# Patient Record
Sex: Female | Born: 1975 | Race: Black or African American | Hispanic: No | Marital: Single | Smoking: Current every day smoker
Health system: Southern US, Community
[De-identification: ages and names within clinical notes are randomized; demographics above are authoritative.]

## PROBLEM LIST (undated history)

## (undated) DIAGNOSIS — L0291 Cutaneous abscess, unspecified: Secondary | ICD-10-CM

## (undated) DIAGNOSIS — Z720 Tobacco use: Secondary | ICD-10-CM

## (undated) DIAGNOSIS — G932 Benign intracranial hypertension: Secondary | ICD-10-CM

## (undated) DIAGNOSIS — J45909 Unspecified asthma, uncomplicated: Secondary | ICD-10-CM

---

## 2008-09-28 ENCOUNTER — Encounter: Admission: RE | Admit: 2008-09-28 | Discharge: 2008-09-28 | Payer: Self-pay | Admitting: Specialist

## 2012-04-14 ENCOUNTER — Encounter (HOSPITAL_COMMUNITY): Payer: Self-pay | Admitting: Emergency Medicine

## 2012-04-14 ENCOUNTER — Emergency Department (HOSPITAL_COMMUNITY): Payer: Medicaid Other

## 2012-04-14 ENCOUNTER — Emergency Department (HOSPITAL_COMMUNITY)
Admission: EM | Admit: 2012-04-14 | Discharge: 2012-04-14 | Disposition: A | Discharge status: Home / Self Care | Payer: Medicaid Other | Attending: Emergency Medicine | Admitting: Emergency Medicine

## 2012-04-14 DIAGNOSIS — E876 Hypokalemia: Secondary | ICD-10-CM | POA: Insufficient documentation

## 2012-04-14 DIAGNOSIS — Z79899 Other long term (current) drug therapy: Secondary | ICD-10-CM | POA: Insufficient documentation

## 2012-04-14 DIAGNOSIS — R079 Chest pain, unspecified: Secondary | ICD-10-CM | POA: Insufficient documentation

## 2012-04-14 DIAGNOSIS — I10 Essential (primary) hypertension: Secondary | ICD-10-CM | POA: Insufficient documentation

## 2012-04-14 DIAGNOSIS — M79603 Pain in arm, unspecified: Secondary | ICD-10-CM

## 2012-04-14 DIAGNOSIS — IMO0002 Reserved for concepts with insufficient information to code with codable children: Secondary | ICD-10-CM | POA: Insufficient documentation

## 2012-04-14 DIAGNOSIS — R55 Syncope and collapse: Secondary | ICD-10-CM | POA: Insufficient documentation

## 2012-04-14 DIAGNOSIS — M542 Cervicalgia: Secondary | ICD-10-CM | POA: Insufficient documentation

## 2012-04-14 DIAGNOSIS — M79609 Pain in unspecified limb: Secondary | ICD-10-CM | POA: Insufficient documentation

## 2012-04-14 DIAGNOSIS — R0602 Shortness of breath: Secondary | ICD-10-CM | POA: Insufficient documentation

## 2012-04-14 HISTORY — DX: Unspecified asthma, uncomplicated: J45.909

## 2012-04-14 LAB — CBC WITH DIFFERENTIAL/PLATELET
Basophils Absolute: 0 10*3/uL (ref 0.0–0.1)
Basophils Relative: 0 % (ref 0–1)
HCT: 43.9 % (ref 36.0–46.0)
Lymphocytes Relative: 21 % (ref 12–46)
Neutro Abs: 7.4 10*3/uL (ref 1.7–7.7)
Neutrophils Relative %: 70 % (ref 43–77)
Platelets: 276 10*3/uL (ref 150–400)
RDW: 16.2 % — ABNORMAL HIGH (ref 11.5–15.5)
WBC: 10.5 10*3/uL (ref 4.0–10.5)

## 2012-04-14 LAB — BASIC METABOLIC PANEL
CO2: 27 mEq/L (ref 19–32)
Chloride: 92 mEq/L — ABNORMAL LOW (ref 96–112)
GFR calc Af Amer: 65 mL/min — ABNORMAL LOW (ref >90)
Potassium: 3.1 mEq/L — ABNORMAL LOW (ref 3.5–5.1)
Sodium: 135 mEq/L (ref 135–145)

## 2012-04-14 LAB — POCT I-STAT TROPONIN I

## 2012-04-14 LAB — D-DIMER, QUANTITATIVE: D-Dimer, Quant: 0.97 ug/mL-FEU — ABNORMAL HIGH (ref 0.00–0.48)

## 2012-04-14 LAB — LACTIC ACID, PLASMA: Lactic Acid, Venous: 1.6 mmol/L (ref 0.5–2.2)

## 2012-04-14 MED ORDER — HYDROCODONE-ACETAMINOPHEN 5-325 MG PO TABS: 1.0000 | ORAL_TABLET | ORAL | Status: DC | PRN

## 2012-04-14 MED ORDER — POTASSIUM CHLORIDE CRYS ER 20 MEQ PO TBCR
40.0000 meq | EXTENDED_RELEASE_TABLET | Freq: Once | ORAL | Status: AC
  Administered 2012-04-14: 40 meq via ORAL
  Filled 2012-04-14: qty 2

## 2012-04-14 MED ORDER — POTASSIUM CHLORIDE CRYS ER 20 MEQ PO TBCR: 20.0000 meq | EXTENDED_RELEASE_TABLET | Freq: Every day | ORAL | Status: DC

## 2012-04-14 MED ORDER — IOHEXOL 350 MG/ML SOLN
100.0000 mL | Freq: Once | INTRAVENOUS | Status: AC | PRN
  Administered 2012-04-14: 100 mL via INTRAVENOUS

## 2012-04-14 MED ORDER — FENTANYL CITRATE 0.05 MG/ML IJ SOLN
50.0000 ug | Freq: Once | INTRAMUSCULAR | Status: AC
  Administered 2012-04-14: 50 ug via INTRAVENOUS
  Filled 2012-04-14: qty 2

## 2012-04-14 MED ORDER — SODIUM CHLORIDE 0.9 % IV BOLUS (SEPSIS)
1000.0000 mL | Freq: Once | INTRAVENOUS | Status: AC
  Administered 2012-04-14: 1000 mL via INTRAVENOUS

## 2012-04-14 NOTE — ED Provider Notes (Signed)
History     CSN: [id number]  Arrival date & time 04/14/12  0128   First MD Initiated Contact with Patient 04/14/12 [phone]      Chief Complaint  Patient presents with  . Shoulder Pain    (Consider location/radiation/quality/duration/timing/severity/associated sxs/prior treatment) HPI 36 year old female presents to emergency room via EMS from home with complaint of left shoulder and arm pain. She reports onset of pain 3 weeks to month ago. It is worsened over the last 24 hours. Patient denies any trauma to the area. Patient was seen at a local general medical clinic on Friday, given a pain shot and Phenergan for her nerves and pain. She is to have an x-ray on Monday for further workup of her pain. Patient has history of asthma, bipolar disorder. Patient presents with extreme pain to her left arm. She denies any chest pain, no shortness of breath, no leg swelling. She denies being on any estrogen, denies smoking, no prolonged immobilization or prolonged car rides. No prior history of DVT or PE. Patient denies any cardiac risk factors aside from hypertension. Patient is a poor historian, appears to be in significant pain, writhing on the bed  Past Medical History  Diagnosis Date  . Asthma     History reviewed. No pertinent past surgical history.  No family history on file.  History  Substance Use Topics  . Smoking status: Current Every Day Smoker    Types: Cigarettes  . Smokeless tobacco: Not on file  . Alcohol Use: No    OB History    Grav Para Term Preterm Abortions TAB SAB Ect Mult Living                  Review of Systems  Unable to perform ROS: Other   patient will not answer all questions asked  Allergies  Aspirin; Shellfish allergy; Latex; and Kiwi extract  Home Medications   Current Outpatient Rx  Name Route Sig Dispense Refill  . DULOXETINE HCL 30 MG PO CPEP Oral Take 30 mg by mouth daily.    . IPRATROPIUM-ALBUTEROL 20-100 MCG/ACT IN AERS Inhalation Inhale 1  puff into the lungs every 6 (six) hours. For shortness breath    . LISINOPRIL-HYDROCHLOROTHIAZIDE 20-25 MG PO TABS Oral Take 1 tablet by mouth daily.    . QUETIAPINE FUMARATE 100 MG PO TABS Oral Take 100 mg by mouth at bedtime.    . TOBRAMYCIN-DEXAMETHASONE 0.3-0.1 % OP OINT Both Eyes Place 1 application into both eyes 2 (two) times daily.      BP 110/83  Pulse 93  Temp 97.5 F (36.4 C) (Oral)  Resp 20  Ht 5\' 2"  (1.575 m)  Wt 265 lb (120.203 kg)  BMI 48.47 kg/m2  SpO2 99%  LMP 03/30/2012  Physical Exam  Nursing note and vitals reviewed. Constitutional: She is oriented to person, place, and time. She appears well-developed and well-nourished. She appears distressed (agitated, rolling on the bed unable to stay still).  HENT:  Head: Normocephalic and atraumatic.  Nose: Nose normal.  Mouth/Throat: Oropharynx is clear and moist.  Eyes: Conjunctivae normal and EOM are normal. Pupils are equal, round, and reactive to light.  Neck: Normal range of motion. Neck supple. No JVD present. No tracheal deviation present. No thyromegaly present.  Cardiovascular: Normal rate, regular rhythm, normal heart sounds and intact distal pulses.  Exam reveals no gallop and no friction rub.   No murmur heard. Pulmonary/Chest: Effort normal and breath sounds normal. No stridor. No respiratory distress. She has  no wheezes. She has no rales. She exhibits no tenderness.  Abdominal: Soft. Bowel sounds are normal. She exhibits no distension and no mass. There is no tenderness. There is no rebound and no guarding.  Musculoskeletal: Normal range of motion. She exhibits tenderness (she was severe tenderness to her left arm, unable to tolerate even light touch. Patient refuses any attempted range of motion. She is tenderness up her left lateral neck and posteriorly). She exhibits no edema.  Lymphadenopathy:    She has no cervical adenopathy.  Neurological: She is alert and oriented to person, place, and time. No cranial  nerve deficit. She exhibits normal muscle tone. Coordination normal.  Skin: No rash noted. She is diaphoretic. No erythema. No pallor.       Cool skin to the touch  Psychiatric: She has a normal mood and affect. Her behavior is normal. Judgment and thought content normal.    ED Course  Procedures (including critical care time)  Labs Reviewed  CBC WITH DIFFERENTIAL - Abnormal; Notable for the following:    RBC 5.34 (*)     Hemoglobin 15.1 (*)     RDW 16.2 (*)     All other components within normal limits  BASIC METABOLIC PANEL - Abnormal; Notable for the following:    Potassium 3.1 (*)     Chloride 92 (*)     Glucose, Bld 124 (*)     Creatinine, Ser 1.24 (*)     GFR calc non Af Amer 56 (*)     GFR calc Af Amer 65 (*)     All other components within normal limits  D-DIMER, QUANTITATIVE - Abnormal; Notable for the following:    D-Dimer, Quant 0.97 (*)     All other components within normal limits  LACTIC ACID, PLASMA  POCT I-STAT TROPONIN I   Dg Cervical Spine Complete  04/14/2012  *RADIOLOGY REPORT*  Clinical Data: Neck pain.  CERVICAL SPINE - COMPLETE 4+ VIEW  Comparison: None.  Findings: There is straightening of the normal cervical lordosis. Vertebral body height and alignment are maintained.  Intervertebral disc space height is maintained.  Prevertebral soft tissues appear normal.  IMPRESSION: Negative exam.   Original Report Authenticated By: Bernadene Bell. Maricela Curet, M.D.    Ct Angio Chest Pe W/cm &/or Wo Cm  04/14/2012  *RADIOLOGY REPORT*  Clinical Data: Shoulder and neck pain.  Shortness of breath. Elevated D-dimer.  CT ANGIOGRAPHY CHEST  Technique:  Multidetector CT imaging of the chest using the standard protocol during bolus administration of intravenous contrast. Multiplanar reconstructed images including MIPs were obtained and reviewed to evaluate the vascular anatomy.  Contrast: OMNIPAQUE IOHEXOL 350 MG/ML SOLN  Comparison: Plain film of the chest earlier this same date.   Findings: No pulmonary embolus is identified.  Heart size is normal.  No axillary, hilar or mediastinal lymphadenopathy is seen. The lungs are clear.  Incidentally imaged upper abdomen is unremarkable.  There is no focal bony abnormality.  IMPRESSION: Negative for pulmonary embolus.  Negative exam.   Original Report Authenticated By: Bernadene Bell. Maricela Curet, M.D.    Dg Chest Port 1 View  04/14/2012  *RADIOLOGY REPORT*  Clinical Data: Chest pain.  PORTABLE CHEST - 1 VIEW  Comparison: None.  Findings: Lungs are clear.  Heart size is normal.  No pneumothorax or pleural fluid.  IMPRESSION: Negative chest.   Original Report Authenticated By: Bernadene Bell. Maricela Curet, M.D.     Date: 04/14/2012  Rate:99  Rhythm: normal sinus rhythm  QRS Axis:  normal  Intervals: QT prolonged  ST/T Wave abnormalities: normal  Conduction Disutrbances:none  Narrative Interpretation:   Old EKG Reviewed: none available    No diagnosis found.    MDM  36 year old female with left shoulder and neck pain, differential includes referred pain from CAD or PE, aortic dissection, cervical disc herniation, or myofascial pain. Patient appears unwell with low blood pressures and is diaphoretic, and is uncooperative with the exam. We'll plan for workup with EKG, troponin, chest x-ray, lactate and d-dimer.      7:44 AM Care passed to Dr. Effie Shy awaiting MRI  Olivia Mackie, MD 04/14/12 [phone]

## 2012-04-14 NOTE — ED Notes (Signed)
Pt c/o nausea.  Went in and pt requesting to go to bathroom.  States she felt like the needed to have bm.  Pt insisted on standing to go to bathroom.  While assisting, pt began swaying while standing up next to bed.  Made pt lie back down.  Pt asked if she could scoot up and bed and pt did so.  Vitals obtained.  Pt remained warm and dry during.  No vomiting.

## 2012-04-14 NOTE — ED Notes (Signed)
Pt ambulated to bathroom denies dizziness pain 4/10

## 2012-04-14 NOTE — ED Provider Notes (Signed)
Jasmin Soto is a 37 y.o. female who is being reevaluated after an MRI, of the cervical and thoracic spine. These tests were done to evaluate for a source of neck and left arm. Pain. I reviewed the images and the radiology findings are documented. There is no evidence for a source for spinal myelopathy. In emergency department patient has declined medications. She continues to decline medications. She had an episode of hypotension with general weakness and near-syncope. Her followup blood pressures have been normal since that time. Of note, she has seen her PCP, and been started on Cymbalta, and Seroquel for this similar pain. Oral potassium, is ordered for hypo-kalemia.  Ambulation trial- she was able to walk easily  Test findings discussed with patient and family members.  Diagnoses #1 nonspecific neck and arm pain #2 hypokalemia  Flint Melter, MD 04/14/12 1645

## 2012-04-14 NOTE — ED Notes (Signed)
Pt alert, arrives from home, c/o shoulder, arm and neck pain, onset was 3 weeks ago, seen PCP, has xray in am, describes as constant, resp even unlabored, skin pwd

## 2012-04-14 NOTE — ED Notes (Signed)
MD at bedside.  EDP Otter present to evaluate this pt

## 2012-04-14 NOTE — ED Notes (Signed)
Pt ambulated in hall to bathroom and back to room.

## 2012-04-14 NOTE — ED Notes (Addendum)
Patient transported to radiology

## 2012-08-28 ENCOUNTER — Other Ambulatory Visit: Payer: Self-pay | Admitting: Diagnostic Neuroimaging

## 2012-08-28 DIAGNOSIS — H5310 Unspecified subjective visual disturbances: Secondary | ICD-10-CM

## 2012-08-28 DIAGNOSIS — R51 Headache: Secondary | ICD-10-CM

## 2012-08-28 DIAGNOSIS — H4710 Unspecified papilledema: Secondary | ICD-10-CM

## 2012-09-04 ENCOUNTER — Ambulatory Visit
Admission: RE | Admit: 2012-09-04 | Discharge: 2012-09-04 | Disposition: A | Discharge status: Home / Self Care | Payer: Medicaid Other | Source: Ambulatory Visit | Attending: Diagnostic Neuroimaging | Admitting: Diagnostic Neuroimaging

## 2012-09-04 DIAGNOSIS — H5310 Unspecified subjective visual disturbances: Secondary | ICD-10-CM

## 2012-09-04 DIAGNOSIS — R51 Headache: Secondary | ICD-10-CM

## 2012-09-04 DIAGNOSIS — H4710 Unspecified papilledema: Secondary | ICD-10-CM

## 2012-09-04 MED ORDER — GADOBENATE DIMEGLUMINE 529 MG/ML IV SOLN: 20.0000 mL | Freq: Once | INTRAVENOUS | Status: AC | PRN

## 2012-09-26 ENCOUNTER — Other Ambulatory Visit: Payer: Self-pay | Admitting: Diagnostic Neuroimaging

## 2012-09-26 DIAGNOSIS — R51 Headache: Secondary | ICD-10-CM

## 2012-09-26 DIAGNOSIS — H4710 Unspecified papilledema: Secondary | ICD-10-CM

## 2012-09-26 DIAGNOSIS — H5310 Unspecified subjective visual disturbances: Secondary | ICD-10-CM

## 2012-10-04 ENCOUNTER — Ambulatory Visit
Admission: RE | Admit: 2012-10-04 | Discharge: 2012-10-04 | Disposition: A | Discharge status: Home / Self Care | Payer: Medicaid Other | Source: Ambulatory Visit | Attending: Diagnostic Neuroimaging | Admitting: Diagnostic Neuroimaging

## 2012-10-04 DIAGNOSIS — H5310 Unspecified subjective visual disturbances: Secondary | ICD-10-CM

## 2012-10-04 DIAGNOSIS — H4710 Unspecified papilledema: Secondary | ICD-10-CM

## 2012-10-04 DIAGNOSIS — R51 Headache: Secondary | ICD-10-CM

## 2014-02-03 IMAGING — CR DG CHEST 1V PORT
1 series · 1 of 1 positions shown · non-contrast
Comparison: None.

CLINICAL DATA: Chest pain.

PORTABLE CHEST - 1 VIEW

[AP]
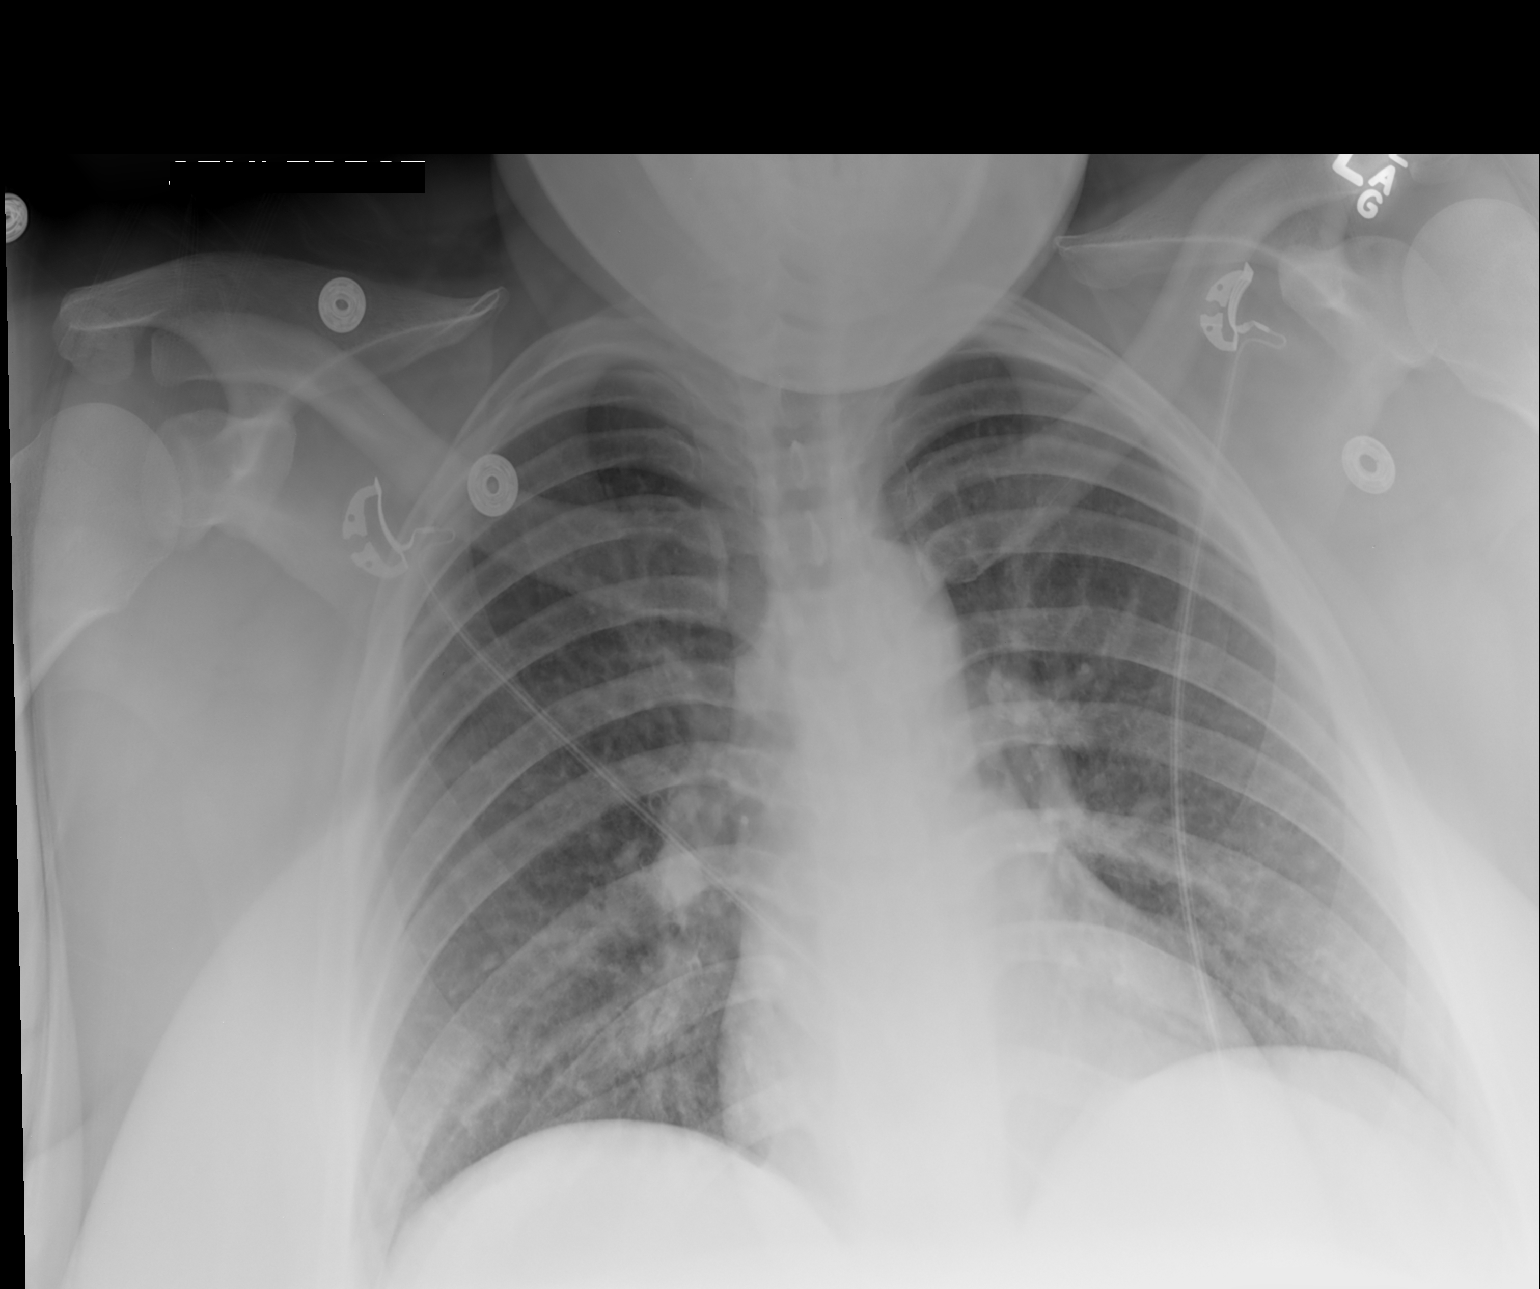

[1 of 1 positions shown; findings below may reference images not displayed]

FINDINGS: Lungs are clear.  Heart size is normal.  No pneumothorax
or pleural fluid.
IMPRESSION: Negative chest.

## 2014-02-03 IMAGING — CR DG CERVICAL SPINE COMPLETE 4+V
8 series · 8 of 8 positions shown · non-contrast
Comparison: None.

CLINICAL DATA: Neck pain.

CERVICAL SPINE - COMPLETE 4+ VIEW

[w cervical spine lat]
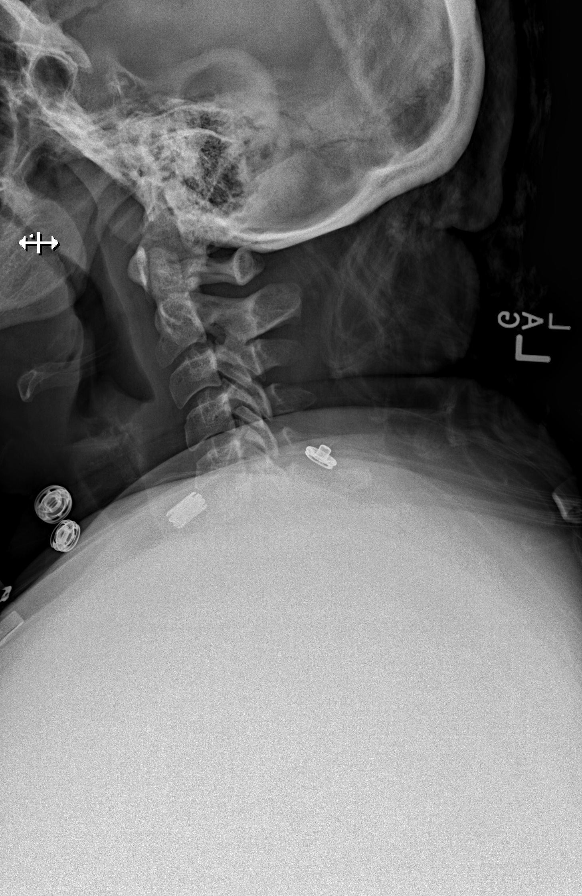

[w cervical spine ap_obl (1 of 4)]
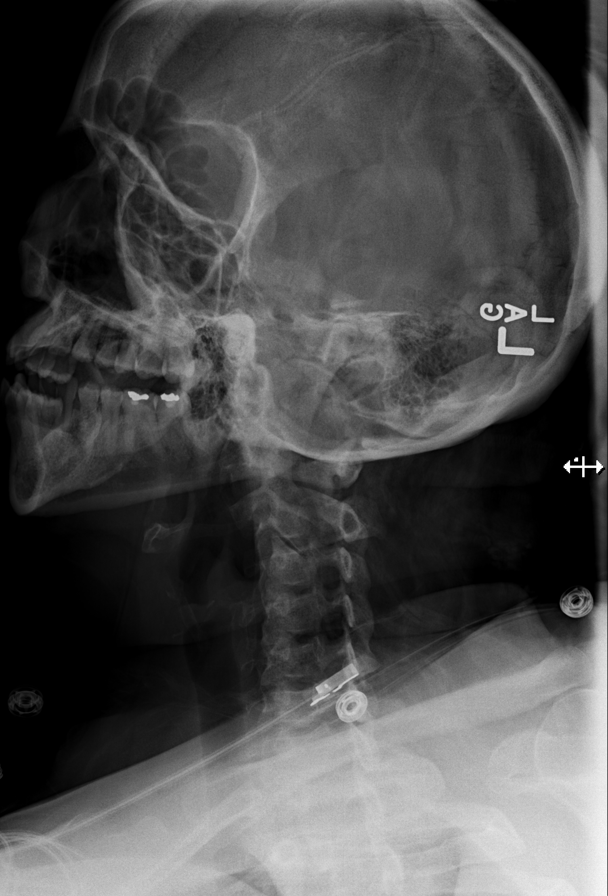

[w cervical spine ap_obl (2 of 4)]
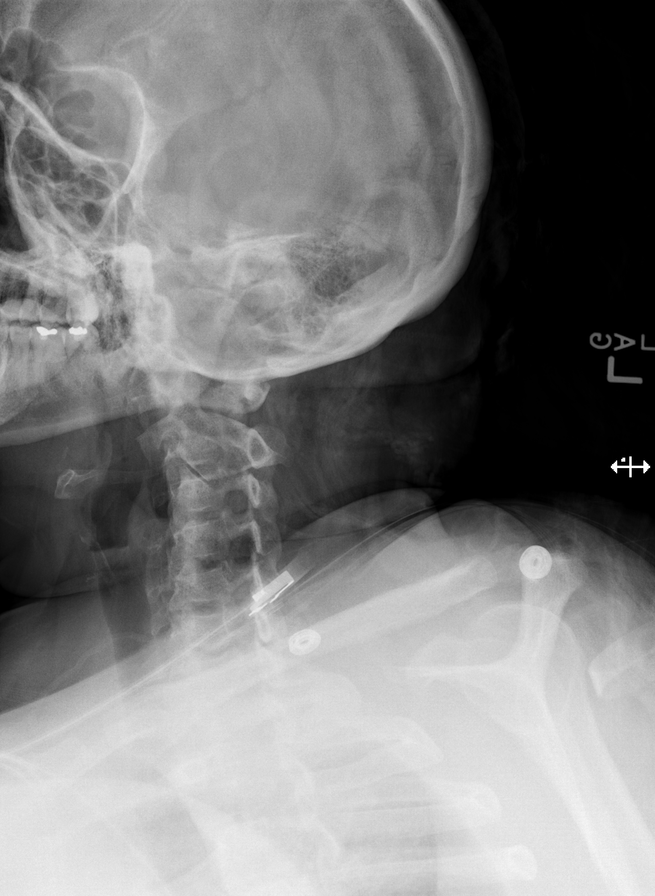

[w cervical spine ap_obl (3 of 4)]
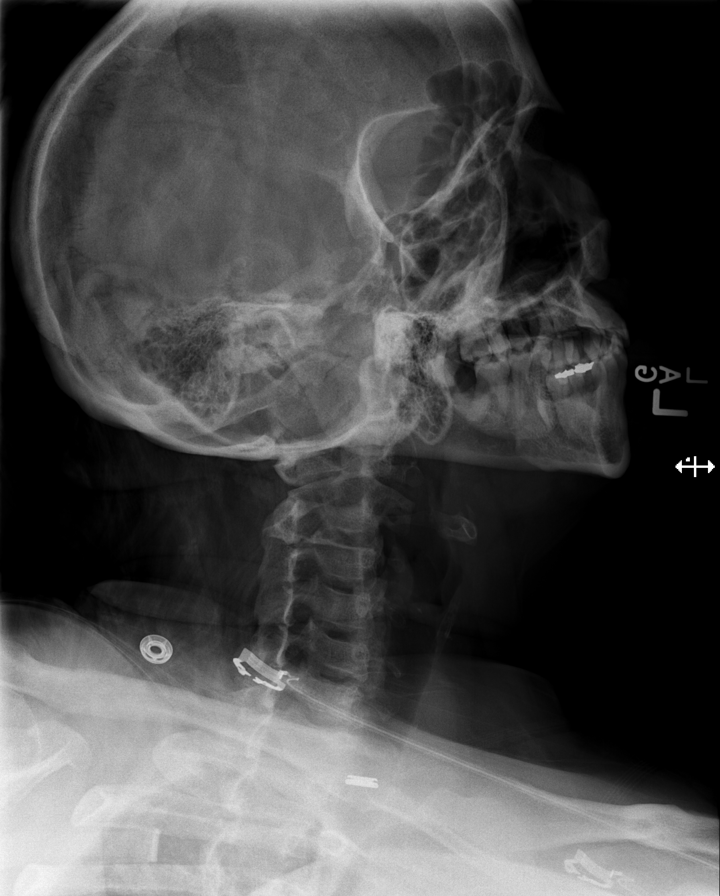

[w cervical spine ap_obl (4 of 4)]
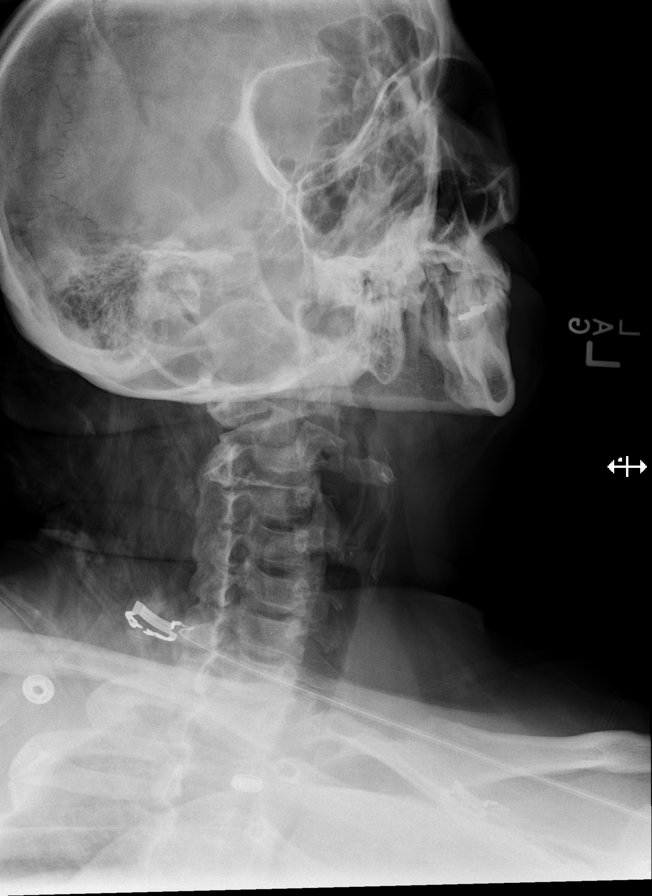

[w cervical spine ap]
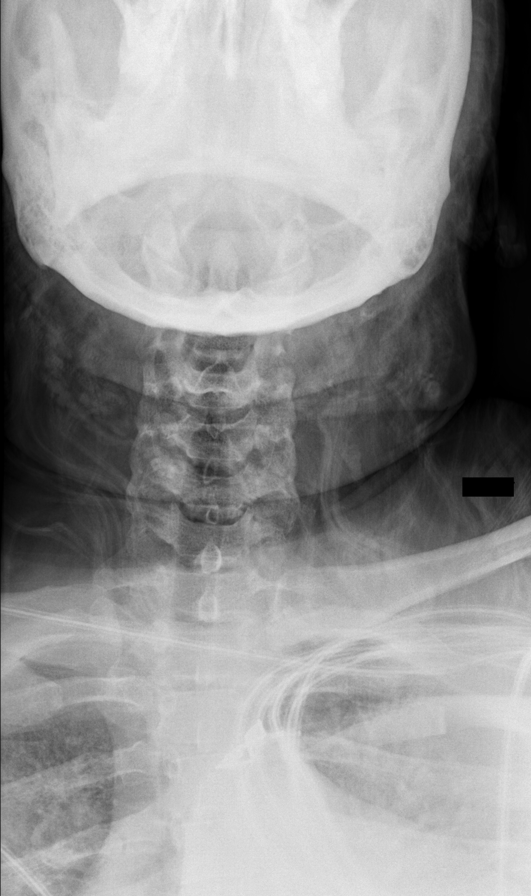

[w cervical spine odontoid]
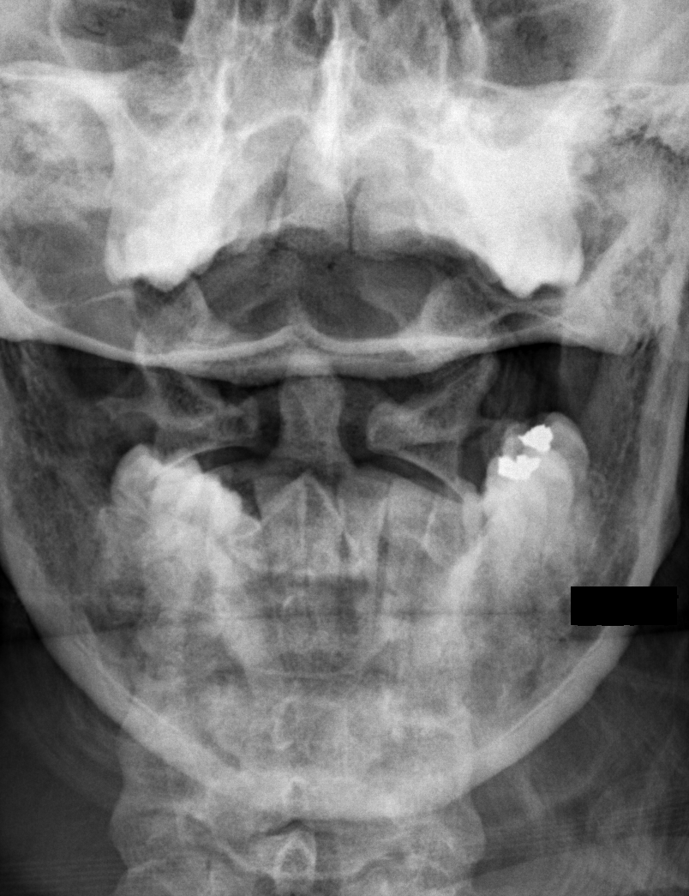

[w cervical swimmers]
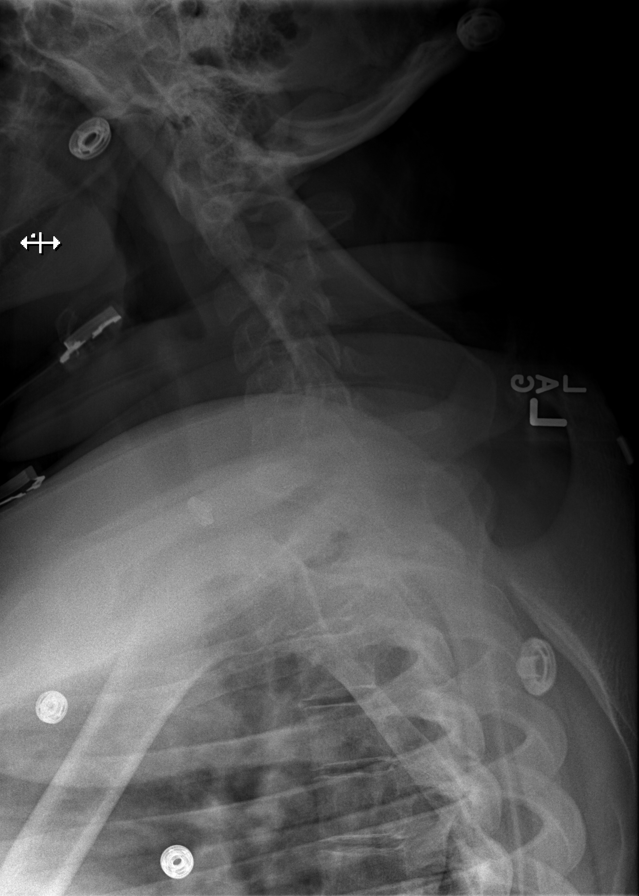

[8 of 8 positions shown; findings below may reference images not displayed]

FINDINGS: There is straightening of the normal cervical lordosis.
Vertebral body height and alignment are maintained.  Intervertebral
disc space height is maintained.  Prevertebral soft tissues appear
normal.
IMPRESSION: Negative exam.

## 2014-03-12 ENCOUNTER — Encounter (HOSPITAL_COMMUNITY): Payer: Self-pay | Admitting: Emergency Medicine

## 2014-03-12 ENCOUNTER — Emergency Department (HOSPITAL_COMMUNITY)
Admission: EM | Admit: 2014-03-12 | Discharge: 2014-03-12 | Disposition: A | Discharge status: Home / Self Care | Payer: Medicaid Other | Attending: Emergency Medicine | Admitting: Emergency Medicine

## 2014-03-12 DIAGNOSIS — J45909 Unspecified asthma, uncomplicated: Secondary | ICD-10-CM | POA: Insufficient documentation

## 2014-03-12 DIAGNOSIS — Z79899 Other long term (current) drug therapy: Secondary | ICD-10-CM | POA: Diagnosis not present

## 2014-03-12 DIAGNOSIS — G43801 Other migraine, not intractable, with status migrainosus: Secondary | ICD-10-CM

## 2014-03-12 DIAGNOSIS — G43909 Migraine, unspecified, not intractable, without status migrainosus: Secondary | ICD-10-CM | POA: Diagnosis present

## 2014-03-12 DIAGNOSIS — F17200 Nicotine dependence, unspecified, uncomplicated: Secondary | ICD-10-CM | POA: Insufficient documentation

## 2014-03-12 DIAGNOSIS — Z91040 Latex allergy status: Secondary | ICD-10-CM | POA: Diagnosis not present

## 2014-03-12 HISTORY — DX: Benign intracranial hypertension: G93.2

## 2014-03-12 MED ORDER — DEXAMETHASONE SODIUM PHOSPHATE 10 MG/ML IJ SOLN
10.0000 mg | Freq: Once | INTRAMUSCULAR | Status: AC
  Administered 2014-03-12: 10 mg via INTRAVENOUS
  Filled 2014-03-12: qty 1

## 2014-03-12 MED ORDER — METOCLOPRAMIDE HCL 5 MG/ML IJ SOLN
10.0000 mg | Freq: Once | INTRAMUSCULAR | Status: AC
  Administered 2014-03-12: 10 mg via INTRAMUSCULAR
  Filled 2014-03-12: qty 2

## 2014-03-12 MED ORDER — DIPHENHYDRAMINE HCL 50 MG/ML IJ SOLN
25.0000 mg | Freq: Once | INTRAMUSCULAR | Status: AC
  Administered 2014-03-12: 25 mg via INTRAVENOUS
  Filled 2014-03-12: qty 1

## 2014-03-12 NOTE — ED Notes (Signed)
Pt reports having migraine for over a month; haivng photophobia

## 2014-03-12 NOTE — Discharge Instructions (Signed)

## 2014-03-12 NOTE — ED Provider Notes (Signed)
CSN: [id number]     Arrival date & time 03/12/14  1047 History   First MD Initiated Contact with Patient 03/12/14 1123     Chief Complaint  Patient presents with  . Migraine     (Consider location/radiation/quality/duration/timing/severity/associated sxs/prior Treatment) HPI Comments: Pt states that she has had a migraine for over a month. Pt states that she has tried tylenol or caffeine without relief. Headache is not different from previous episodes it just wont go away. Denies fever, vomiting, stiff neck. Has had nausea and photophobia pt states that he is on topamax chronically. States that she was diagnosed with pseudotumor cerebri about 2 years ago  The history is provided by the patient. No language interpreter was used.    Past Medical History  Diagnosis Date  . Asthma   . Pseudotumor cerebri    History reviewed. No pertinent past surgical history. History reviewed. No pertinent family history. History  Substance Use Topics  . Smoking status: Current Every Day Smoker    Types: Cigarettes  . Smokeless tobacco: Not on file  . Alcohol Use: No   OB History   Grav Para Term Preterm Abortions TAB SAB Ect Mult Living                 Review of Systems  Constitutional: Negative.   Respiratory: Negative.   Cardiovascular: Negative.       Allergies  Aspirin; Shellfish allergy; Kiwi extract; and Latex  Home Medications   Prior to Admission medications   Medication Sig Start Date End Date Taking? Authorizing Provider  albuterol (PROVENTIL HFA;VENTOLIN HFA) 108 (90 BASE) MCG/ACT inhaler Inhale 2 puffs into the lungs every 6 (six) hours as needed for wheezing or shortness of breath.   Yes Historical Provider, MD  topiramate (TOPAMAX) 50 MG tablet Take 50 mg by mouth daily.   Yes Historical Provider, MD   BP 117/72  Pulse 81  Temp(Src) 98.1 F (36.7 C) (Oral)  Resp 18  Ht 5\' 2"  (1.575 m)  Wt 270 lb (122.471 kg)  BMI 49.37 kg/m2  SpO2 100%  LMP 02/26/2014 Physical  Exam  Nursing note and vitals reviewed. Constitutional: She is oriented to person, place, and time. She appears well-developed and well-nourished.  HENT:  Head: Normocephalic and atraumatic.  Right Ear: External ear normal.  Left Ear: External ear normal.  Eyes: Conjunctivae and EOM are normal. Pupils are equal, round, and reactive to light.  Neck: Normal range of motion. Neck supple.  Cardiovascular: Normal rate and regular rhythm.   Pulmonary/Chest: Effort normal and breath sounds normal.  Musculoskeletal: Normal range of motion.  Neurological: She is alert and oriented to person, place, and time.  Skin: Skin is warm and dry.  Psychiatric: She has a normal mood and affect.    ED Course  Procedures (including critical care time) Labs Review Labs Reviewed - No data to display  Imaging Review No results found.   EKG Interpretation None      MDM   Final diagnoses:  Other migraine with status migrainosus, not intractable    Pt is feeling better at this time. Headache consistent with previous headaches. Will send home. Pt told to follow up with neurology or come back for return or symptoms    Teressa LowerVrinda Nyesha Cliff, NP 03/12/14 1341

## 2014-03-13 NOTE — ED Provider Notes (Signed)
Medical screening examination/treatment/procedure(s) were performed by non-physician practitioner and as supervising physician I was immediately available for consultation/collaboration.   Tianah Lonardo T Gregery Walberg, MD 03/13/14 1449 

## 2014-04-15 ENCOUNTER — Encounter: Payer: Self-pay | Admitting: Diagnostic Neuroimaging

## 2014-04-15 ENCOUNTER — Ambulatory Visit (INDEPENDENT_AMBULATORY_CARE_PROVIDER_SITE_OTHER): Payer: Medicaid Other | Admitting: Diagnostic Neuroimaging

## 2014-04-15 ENCOUNTER — Encounter (INDEPENDENT_AMBULATORY_CARE_PROVIDER_SITE_OTHER): Payer: Self-pay

## 2014-04-15 VITALS — BP 104/74 | HR 69 | Temp 98.2°F | Ht 62.0 in | Wt 269.6 lb

## 2014-04-15 DIAGNOSIS — G43009 Migraine without aura, not intractable, without status migrainosus: Secondary | ICD-10-CM

## 2014-04-15 DIAGNOSIS — G932 Benign intracranial hypertension: Secondary | ICD-10-CM

## 2014-04-15 NOTE — Patient Instructions (Signed)
I will check LP.  Continue trokendi.

## 2014-04-15 NOTE — Progress Notes (Signed)
[facility]  PATIENT: Jasmin Soto DOB: 06-15-1976  REFERRING CLINICIAN:  HISTORY FROM: patient  REASON FOR VISIT: follow up    HISTORICAL  CHIEF COMPLAINT:  Chief Complaint  Patient presents with  . Follow-up    HISTORY OF PRESENT ILLNESS:    UPDATE 04/15/14: Since last visit, lost to follow up due to family, financial issues. HA are worsening. Had transient visual obscuration a few weeks ago, lasting 1-2 hours.  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 Will proceed with IIH (pseudotumor cerebri) evaluation. HA still bad. Arm pain still bad. EMG and labs normal. Insurance denied her MRI studies that I ordered last visit.  PRIOR HPI (05/27/12): 38 year old right-handed female history of hypertension, asthma, depression, anxiety, here for evaluation of headache, neck pain, numbness and tingling since November 2013. Patient describes sudden onset pain in her neck, shoulders and head. She had some photophobia and throbbing headache pain. No nausea vomiting. She also felt numbness and tingling going into her arms and fingers. Symptoms most severe in her left hand, digits 4 and 5. Patient went to the emergency room by ambulance, had MRI of the neck which was unremarkable. Since that time symptoms have subsided a little bit. She has had intermittent weakness in her left hand. Also patient notes insidious onset of decreased vision in her left eye.   REVIEW OF SYSTEMS: Full 14 system review of systems performed and notable only for activity change fatigue hearing loss ringing in ears like sensory loss of vision blurred vision rectal bleeding cold intolerance diarrhea nausea rectal pain and frequent waking snoring neck pain back pain next is anxiety headache numbness anemia environmental allergy.  ALLERGIES: Allergies  Allergen Reactions  . Aspirin Anaphylaxis  . Shellfish Allergy Anaphylaxis  . Kiwi Extract Rash    Reaction: rash around  mouth, with tongue burning  . Latex Hives    HOME MEDICATIONS: Outpatient Prescriptions Prior to Visit  Medication Sig Dispense Refill  . albuterol (PROVENTIL HFA;VENTOLIN HFA) 108 (90 BASE) MCG/ACT inhaler Inhale 2 puffs into the lungs every 6 (six) hours as needed for wheezing or shortness of breath.      . topiramate (TOPAMAX) 50 MG tablet Take 50 mg by mouth daily.       No facility-administered medications prior to visit.    PAST MEDICAL HISTORY: Past Medical History  Diagnosis Date  . Asthma   . Pseudotumor cerebri     PAST SURGICAL HISTORY: History reviewed. No pertinent past surgical history.  FAMILY HISTORY: Family History  Problem Relation Age of Onset  . Pulmonary embolism Mother   . Pseudotumor cerebri Mother   . Heart attack Father   . Blindness Other     SOCIAL HISTORY:  History   Social History  . Marital Status: Single    Spouse Name: N/A    Number of Children: 2  . Years of Education: HS   Occupational History  .  Other    n/a   Social History Main Topics  . Smoking status: Current Every Day Smoker -- 0.75 packs/day for 20 years    Types: Cigarettes  . Smokeless tobacco: Never Used  . Alcohol Use: Yes     Comment: socially  . Drug Use: Yes    Special: Marijuana     Comment: occasionally  . Sexual Activity: Not on file   Other Topics Concern  . Not on file   Social History Narrative   Patient lives at home  PHYSICAL EXAM  Filed Vitals:   04/15/14 1446  BP: 104/74  Pulse: 69  Temp: 98.2 F (36.8 C)  TempSrc: Oral  Height:  (1.575 m)  Weight: 269 lb 9.6 oz (122.29 kg)    Not recorded    Body mass index is 49.3 kg/(m^2).  GENERAL EXAM: Patient is in no distress; well developed, nourished and groomed; neck is supple  CARDIOVASCULAR: Regular rate and rhythm, no murmurs, no carotid bruits  NEUROLOGIC: MENTAL STATUS: awake, alert, language fluent, comprehension intact, naming intact, fund of knowledge  appropriate CRANIAL NERVE: PHOTOSENSITIVE; BLURRED DISC MARGINS, pupils equal and reactive to light, visual fields full to confrontation, extraocular muscles intact, no nystagmus, facial sensation and strength symmetric, hearing intact, palate elevates symmetrically, uvula midline, shoulder shrug symmetric, tongue midline. MOTOR: normal bulk and tone, full strength in the BUE, BLE SENSORY: normal and symmetric to light touch, pinprick, temperature, vibration COORDINATION: finger-nose-finger, fine finger movements, heel-shin normal REFLEXES: deep tendon reflexes present and symmetric GAIT/STATION: narrow based gait; romberg is negative    DIAGNOSTIC DATA (LABS, IMAGING, TESTING) - I reviewed patient records, labs, notes, testing and imaging myself where available.  Lab Results  Component Value Date   WBC 10.5 04/14/2012   HGB 15.1* 04/14/2012   HCT 43.9 04/14/2012   MCV 82.2 04/14/2012   PLT 276 04/14/2012      Component Value Date/Time   NA 135 04/14/2012 0230   K 3.1* 04/14/2012 0230   CL 92* 04/14/2012 0230   CO2 27 04/14/2012 0230   GLUCOSE 124* 04/14/2012 0230   BUN 12 04/14/2012 0230   CREATININE 1.24* 04/14/2012 0230   CALCIUM 10.1 04/14/2012 0230   GFRNONAA 56* 04/14/2012 0230   GFRAA 65* 04/14/2012 0230   No results found for this basename: CHOL, HDL, LDLCALC, LDLDIRECT, TRIG, CHOLHDL   No results found for this basename: HGBA1C   No results found for this basename: VITAMINB12   No results found for this basename: TSH    I reviewed images myself and agree with interpretation. -VRP  09/04/12 MRI BRAIN  1. Partially empty sella and slightly enlarged optic nerve sheaths. Non-specific finding, but can be seen in association with idiopathic intracranial hypertension (IIH or pseudotumor cerebri). 2. Remainder of brain parenchyma unremarkable.  10/04/12 MRV head - Normal MRV head (without contrast). Left side dominant and right side hypoplastic venous drainage pattern may be a normal  variant.   ASSESSMENT AND PLAN  38 y.o. year old female here with suspected pseudotumor cerebri (HA, papilledema, obesity), worsening recently.    PLAN: - therpeutic and diagnostic lumbar puncture - weight loss advice reviewed - continue topiramate ER  daily - serial eye exams with Dr. Dione Booze  Orders Placed This Encounter  Procedures  . DG FLUORO GUIDE LUMBAR PUNCTURE   Return in about 3 months (around 07/15/2014).    Suanne Marker, MD 04/15/2014, 3:47 PM Certified in Neurology, Neurophysiology and Neuroimaging  [facility] [address] [place], [place] [address] [phone]

## 2014-04-29 ENCOUNTER — Ambulatory Visit
Admission: RE | Admit: 2014-04-29 | Discharge: 2014-04-29 | Disposition: A | Discharge status: Home / Self Care | Payer: Medicaid Other | Source: Ambulatory Visit | Attending: Diagnostic Neuroimaging | Admitting: Diagnostic Neuroimaging

## 2014-04-29 DIAGNOSIS — G932 Benign intracranial hypertension: Secondary | ICD-10-CM

## 2014-04-29 DIAGNOSIS — G43009 Migraine without aura, not intractable, without status migrainosus: Secondary | ICD-10-CM

## 2014-04-29 LAB — CSF CELL COUNT WITH DIFFERENTIAL
RBC COUNT CSF: 21 uL — AB
Tube #: 3
WBC CSF: 3 uL (ref 0–5)

## 2014-04-29 LAB — GLUCOSE, CSF: Glucose, CSF: 60 mg/dL (ref 43–76)

## 2014-04-29 LAB — PROTEIN, CSF: Total Protein, CSF: 37 mg/dL (ref 15–45)

## 2014-04-29 MED ORDER — DIAZEPAM 5 MG PO TABS
10.0000 mg | ORAL_TABLET | Freq: Once | ORAL | Status: AC
  Administered 2014-04-29: 10 mg via ORAL

## 2014-04-29 NOTE — Discharge Instructions (Signed)

## 2014-05-02 LAB — CSF CULTURE
GRAM STAIN: NONE SEEN
GRAM STAIN: NONE SEEN
ORGANISM ID, BACTERIA: NO GROWTH

## 2014-05-02 LAB — CSF CULTURE W GRAM STAIN

## 2014-06-15 ENCOUNTER — Ambulatory Visit (INDEPENDENT_AMBULATORY_CARE_PROVIDER_SITE_OTHER): Payer: Medicaid Other | Admitting: Diagnostic Neuroimaging

## 2014-06-15 ENCOUNTER — Encounter: Payer: Self-pay | Admitting: Diagnostic Neuroimaging

## 2014-06-15 VITALS — BP 119/78 | HR 75 | Temp 97.2°F | Ht 62.0 in | Wt 268.2 lb

## 2014-06-15 DIAGNOSIS — G932 Benign intracranial hypertension: Secondary | ICD-10-CM

## 2014-06-15 MED ORDER — TOPIRAMATE 50 MG PO TABS: 100.0000 mg | ORAL_TABLET | Freq: Two times a day (BID) | ORAL | Status: DC

## 2014-06-15 NOTE — Progress Notes (Signed)
[facility]  PATIENT: Jasmin Soto DOB: [birth year]  REFERRING CLINICIAN:  HISTORY FROM: patient  REASON FOR VISIT: follow up    HISTORICAL  CHIEF COMPLAINT:  Chief Complaint  Patient presents with  . Follow-up    HISTORY OF PRESENT ILLNESS:   UPDATE 06/15/14: Since last visit, still has not seen Dr. Dione BoozeGroat or PCP. Had LP (opening pressure 35, closing pressure 10).   UPDATE 04/15/14: Since last visit, lost to follow up due to family, financial issues. HA are worsening. Had transient visual obscuration a few weeks ago, lasting 1-2 hours.  UPDATE 08/25/12: Since last visit, Dr. Dione BoozeGroat dx'd chronic papilledema. Will proceed with IIH (pseudotumor cerebri) evaluation. HA still bad. Arm pain still bad. EMG and labs normal. Insurance denied her MRI studies that I ordered last visit.  PRIOR HPI (05/27/12): 38 year old right-handed female history of hypertension, asthma, depression, anxiety, here for evaluation of headache, neck pain, numbness and tingling since November 2013. Patient describes sudden onset pain in her neck, shoulders and head. She had some photophobia and throbbing headache pain. No nausea vomiting. She also felt numbness and tingling going into her arms and fingers. Symptoms most severe in her left hand, digits 4 and 5. Patient went to the emergency room by ambulance, had MRI of the neck which was unremarkable. Since that time symptoms have subsided a little bit. She has had intermittent weakness in her left hand. Also patient notes insidious onset of decreased vision in her left eye.   REVIEW OF SYSTEMS: Full 14 system review of systems performed and notable only for activity change fatigue hearing loss chest tightness ringing in ears like sensory loss of vision blurred vision rectal pain cold intolerance diarrhea frequent waking snoring neck pain back pain next is anxiety headache numbness anemia environmental allergy.  ALLERGIES: Allergies  Allergen  Reactions  . Aspirin Anaphylaxis  . Shellfish Allergy Anaphylaxis  . Adhesive [Tape] Rash and Other (See Comments)    bandaids tear skin off  . Iodine Swelling and Rash  . Kiwi Extract Rash    Reaction: rash around mouth, with tongue burning  . Latex Hives    HOME MEDICATIONS: Outpatient Prescriptions Prior to Visit  Medication Sig Dispense Refill  . albuterol (PROVENTIL HFA;VENTOLIN HFA) 108 (90 BASE) MCG/ACT inhaler Inhale 2 puffs into the lungs every 6 (six) hours as needed for wheezing or shortness of breath.    . gabapentin (NEURONTIN) 100 MG capsule Take 100 mg by mouth 2 (two) times daily.    . Topiramate ER (TROKENDI XR) 100 MG CP24 Take 1 capsule by mouth daily.     No facility-administered medications prior to visit.    PAST MEDICAL HISTORY: Past Medical History  Diagnosis Date  . Asthma   . Pseudotumor cerebri     PAST SURGICAL HISTORY: History reviewed. No pertinent past surgical history.  FAMILY HISTORY: Family History  Problem Relation Age of Onset  . Pulmonary embolism Mother   . Pseudotumor cerebri Mother   . Heart attack Father   . Blindness Other     SOCIAL HISTORY:  History   Social History  . Marital Status: Single    Spouse Name: N/A    Number of Children: 2  . Years of Education: HS   Occupational History  .  Other    n/a   Social History Main Topics  . Smoking status: Current Every Day Smoker -- 0.75 packs/day for 20 years    Types: Cigarettes  . Smokeless tobacco:  Never Used  . Alcohol Use: Yes     Comment: socially  . Drug Use: Yes    Special: Marijuana     Comment: occasionally  . Sexual Activity: Not on file   Other Topics Concern  . Not on file   Social History Narrative   Patient lives at home     PHYSICAL EXAM  Filed Vitals:   06/15/14 1309  BP: 119/78  Pulse: 75  Temp: 97.2 F (36.2 C)  TempSrc: Oral  Height: 5\' 2"  (1.575 m)  Weight: 268 lb 3.2 oz (121.655 kg)    Not recorded      Body mass index  is 49.04 kg/(m^2).  GENERAL EXAM: Patient is in no distress; well developed, nourished and groomed; neck is supple  CARDIOVASCULAR: Regular rate and rhythm, no murmurs, no carotid bruits  NEUROLOGIC: MENTAL STATUS: awake, alert, language fluent, comprehension intact, naming intact, fund of knowledge appropriate CRANIAL NERVE: PHOTOSENSITIVE; BLURRED DISC MARGINS, pupils equal and reactive to light, visual fields full to confrontation, extraocular muscles intact, no nystagmus, facial sensation and strength symmetric, hearing intact, palate elevates symmetrically, uvula midline, shoulder shrug symmetric, tongue midline. MOTOR: normal bulk and tone, full strength in the BUE, BLE SENSORY: normal and symmetric to light touch, pinprick, temperature, vibration COORDINATION: finger-nose-finger, fine finger movements normal REFLEXES: deep tendon reflexes present and symmetric GAIT/STATION: narrow based gait; romberg is negative    DIAGNOSTIC DATA (LABS, IMAGING, TESTING) - I reviewed patient records, labs, notes, testing and imaging myself where available.  Lab Results  Component Value Date   WBC 10.5 04/14/2012   HGB 15.1* 04/14/2012   HCT 43.9 04/14/2012   MCV 82.2 04/14/2012   PLT 276 04/14/2012      Component Value Date/Time   NA 135 04/14/2012 0230   K 3.1* 04/14/2012 0230   CL 92* 04/14/2012 0230   CO2 27 04/14/2012 0230   GLUCOSE 124* 04/14/2012 0230   BUN 12 04/14/2012 0230   CREATININE 1.24* 04/14/2012 0230   CALCIUM 10.1 04/14/2012 0230   GFRNONAA 56* 04/14/2012 0230   GFRAA 65* 04/14/2012 0230   No results found for: CHOL No results found for: HGBA1C No results found for: VITAMINB12 No results found for: TSH  I reviewed images myself and agree with interpretation. -VRP  09/04/12 MRI BRAIN  1. Partially empty sella and slightly enlarged optic nerve sheaths. Non-specific finding, but can be seen in association with idiopathic intracranial hypertension (IIH or  pseudotumor cerebri). 2. Remainder of brain parenchyma unremarkable.  10/04/12 MRV head - Normal MRV head (without contrast). Left side dominant and right side hypoplastic venous drainage pattern may be a normal variant.  04/29/14 LP 1. Technically successful lumbar puncture under fluoroscopy. 2. Elevated opening pressure (35cm H2O), normalized after large volume tap (down to 10cm H2O).    ASSESSMENT AND PLAN  38 y.o. year old female here with pseudotumor cerebri (HA, papilledema, obesity), worsening recently. Many barriers (family, personal, psychosocial) to her improvement in care.    PLAN: - weight loss advice reviewed; will refer to nutritionist - she declines bariatric surgery evaluation - increase topiramate to 100mg  BID - serial eye exams with Dr. Dione BoozeGroat  Orders Placed This Encounter  Procedures  . Ambulatory referral to Nutrition and Diabetic Education   Return in about 3 months (around 09/15/2014).    Suanne MarkerVIKRAM R. PENUMALLI, MD 06/15/2014, 1:50 PM Certified in Neurology, Neurophysiology and Neuroimaging  [facility] [address], [address] [place], [place] [address] [phone]

## 2014-06-15 NOTE — Patient Instructions (Signed)
Increase topiramate to 100mg twice a day. 

## 2014-07-02 ENCOUNTER — Encounter (HOSPITAL_COMMUNITY): Payer: Self-pay | Admitting: Emergency Medicine

## 2014-07-02 ENCOUNTER — Inpatient Hospital Stay (HOSPITAL_COMMUNITY)
Admission: EM | Admit: 2014-07-02 | Discharge: 2014-07-03 | DRG: 872 | Disposition: A | Discharge status: Home / Self Care | Payer: Medicaid Other | Attending: Internal Medicine | Admitting: Internal Medicine

## 2014-07-02 DIAGNOSIS — G932 Benign intracranial hypertension: Secondary | ICD-10-CM | POA: Diagnosis present

## 2014-07-02 DIAGNOSIS — J45909 Unspecified asthma, uncomplicated: Secondary | ICD-10-CM | POA: Diagnosis present

## 2014-07-02 DIAGNOSIS — E669 Obesity, unspecified: Secondary | ICD-10-CM | POA: Diagnosis present

## 2014-07-02 DIAGNOSIS — R55 Syncope and collapse: Secondary | ICD-10-CM

## 2014-07-02 DIAGNOSIS — L0390 Cellulitis, unspecified: Secondary | ICD-10-CM

## 2014-07-02 DIAGNOSIS — Z6841 Body Mass Index (BMI) 40.0 and over, adult: Secondary | ICD-10-CM | POA: Diagnosis not present

## 2014-07-02 DIAGNOSIS — D509 Iron deficiency anemia, unspecified: Secondary | ICD-10-CM | POA: Diagnosis present

## 2014-07-02 DIAGNOSIS — A419 Sepsis, unspecified organism: Secondary | ICD-10-CM | POA: Diagnosis present

## 2014-07-02 DIAGNOSIS — L0291 Cutaneous abscess, unspecified: Secondary | ICD-10-CM | POA: Diagnosis present

## 2014-07-02 DIAGNOSIS — Z720 Tobacco use: Secondary | ICD-10-CM | POA: Diagnosis present

## 2014-07-02 DIAGNOSIS — F17210 Nicotine dependence, cigarettes, uncomplicated: Secondary | ICD-10-CM | POA: Diagnosis present

## 2014-07-02 DIAGNOSIS — L03317 Cellulitis of buttock: Secondary | ICD-10-CM | POA: Diagnosis present

## 2014-07-02 HISTORY — DX: Tobacco use: Z72.0

## 2014-07-02 HISTORY — DX: Cutaneous abscess, unspecified: L02.91

## 2014-07-02 LAB — BASIC METABOLIC PANEL
ANION GAP: 17 — AB (ref 5–15)
BUN: 7 mg/dL (ref 6–23)
CHLORIDE: 102 meq/L (ref 96–112)
CO2: 22 mEq/L (ref 19–32)
Calcium: 9 mg/dL (ref 8.4–10.5)
Creatinine, Ser: 0.97 mg/dL (ref 0.50–1.10)
GFR calc non Af Amer: 74 mL/min — ABNORMAL LOW (ref >90)
GFR, EST AFRICAN AMERICAN: 86 mL/min — AB (ref >90)
Glucose, Bld: 117 mg/dL — ABNORMAL HIGH (ref 70–99)
POTASSIUM: 3.7 meq/L (ref 3.7–5.3)
SODIUM: 141 meq/L (ref 137–147)

## 2014-07-02 LAB — CBC
HCT: 34.9 % — ABNORMAL LOW (ref 36.0–46.0)
Hemoglobin: 10.3 g/dL — ABNORMAL LOW (ref 12.0–15.0)
MCH: 22.6 pg — ABNORMAL LOW (ref 26.0–34.0)
MCHC: 29.5 g/dL — ABNORMAL LOW (ref 30.0–36.0)
MCV: 76.7 fL — ABNORMAL LOW (ref 78.0–100.0)
PLATELETS: 295 10*3/uL (ref 150–400)
RBC: 4.55 MIL/uL (ref 3.87–5.11)
RDW: 18.1 % — AB (ref 11.5–15.5)
WBC: 7.1 10*3/uL (ref 4.0–10.5)

## 2014-07-02 MED ORDER — LATANOPROST 0.005 % OP SOLN
1.0000 [drp] | Freq: Every day | OPHTHALMIC | Status: DC
  Administered 2014-07-02: 1 [drp] via OPHTHALMIC
  Filled 2014-07-02 (×2): qty 2.5

## 2014-07-02 MED ORDER — ACETAMINOPHEN 325 MG PO TABS: 650.0000 mg | ORAL_TABLET | Freq: Four times a day (QID) | ORAL | Status: DC | PRN

## 2014-07-02 MED ORDER — SODIUM CHLORIDE 0.9 % IV BOLUS (SEPSIS)
1000.0000 mL | Freq: Once | INTRAVENOUS | Status: AC
  Administered 2014-07-02: 1000 mL via INTRAVENOUS

## 2014-07-02 MED ORDER — ACETAMINOPHEN 650 MG RE SUPP: 650.0000 mg | Freq: Four times a day (QID) | RECTAL | Status: DC | PRN

## 2014-07-02 MED ORDER — SODIUM CHLORIDE 0.9 % IV SOLN: INTRAVENOUS | Status: DC

## 2014-07-02 MED ORDER — GABAPENTIN 100 MG PO CAPS
100.0000 mg | ORAL_CAPSULE | Freq: Two times a day (BID) | ORAL | Status: DC
  Administered 2014-07-02 – 2014-07-03 (×2): 100 mg via ORAL
  Filled 2014-07-02 (×4): qty 1

## 2014-07-02 MED ORDER — PIPERACILLIN-TAZOBACTAM 3.375 G IVPB 30 MIN
3.3750 g | INTRAVENOUS | Status: AC
  Administered 2014-07-02: 3.375 g via INTRAVENOUS

## 2014-07-02 MED ORDER — ALUM & MAG HYDROXIDE-SIMETH 200-200-20 MG/5ML PO SUSP: 30.0000 mL | Freq: Four times a day (QID) | ORAL | Status: DC | PRN

## 2014-07-02 MED ORDER — ALBUTEROL SULFATE (2.5 MG/3ML) 0.083% IN NEBU: 3.0000 mL | INHALATION_SOLUTION | Freq: Four times a day (QID) | RESPIRATORY_TRACT | Status: DC | PRN

## 2014-07-02 MED ORDER — VANCOMYCIN HCL 10 G IV SOLR
2500.0000 mg | INTRAVENOUS | Status: AC
  Administered 2014-07-02: 2500 mg via INTRAVENOUS
  Filled 2014-07-02: qty 2500

## 2014-07-02 MED ORDER — ENOXAPARIN SODIUM 40 MG/0.4ML ~~LOC~~ SOLN: 40.0000 mg | SUBCUTANEOUS | Status: DC

## 2014-07-02 MED ORDER — PIPERACILLIN-TAZOBACTAM 3.375 G IVPB
3.3750 g | Freq: Three times a day (TID) | INTRAVENOUS | Status: DC
  Administered 2014-07-03 (×2): 3.375 g via INTRAVENOUS
  Filled 2014-07-02 (×3): qty 50

## 2014-07-02 MED ORDER — PROMETHAZINE HCL 25 MG PO TABS: 12.5000 mg | ORAL_TABLET | Freq: Four times a day (QID) | ORAL | Status: DC | PRN

## 2014-07-02 MED ORDER — OXYCODONE HCL 5 MG PO TABS: 5.0000 mg | ORAL_TABLET | ORAL | Status: DC | PRN

## 2014-07-02 MED ORDER — VANCOMYCIN HCL 10 G IV SOLR
1250.0000 mg | Freq: Two times a day (BID) | INTRAVENOUS | Status: DC
  Administered 2014-07-03: 1250 mg via INTRAVENOUS
  Filled 2014-07-02 (×2): qty 1250

## 2014-07-02 MED ORDER — ENOXAPARIN SODIUM 60 MG/0.6ML ~~LOC~~ SOLN
60.0000 mg | SUBCUTANEOUS | Status: DC
  Filled 2014-07-02 (×3): qty 0.6

## 2014-07-02 MED ORDER — TOPIRAMATE 100 MG PO TABS
100.0000 mg | ORAL_TABLET | Freq: Two times a day (BID) | ORAL | Status: DC
  Administered 2014-07-02 – 2014-07-03 (×2): 100 mg via ORAL
  Filled 2014-07-02 (×4): qty 1

## 2014-07-02 NOTE — H&P (Signed)
History and Physical:    Jasmin ChurnCarol A Loren JYN:[id number] DOB: Oct 10, 1975 DOA: 07/02/2014  Referring physician: Dr. Lebron ConnersMarcy Phiffer PCP: Pcp Not In System Dr[facility] Lerry Linerwight [facility]  Chief Complaint: Syncope  History of Present Illness:   Jasmin Soto is an 38 y.o. female with a PMH of asthma, pseudotumor cerebri, tobacco abuse and soft tissue infection/abscess requiring lancing who presented to the ER after having an abscess on her right buttock drained by her PCP.  The patient became quite upset when told she had to have the abscess drained since when she had this done in the past, it was quite painful.  She tells me she had "a panic attack" but was able to get through the procedure.  The patient then went to the pharmacy to get her prescriptions filled, and while checking out, she became dizzy and had to sit down.  She reports that she felt a hot flush, then had shaking chills.  EMS was called, and she was brought to the ER where she was found to be tachycardic and hypotensive.  We are asked to admit her for sepsis secondary to cellulitis/abscess.  ROS:   Constitutional: No fever, + chills;  Appetite OK; No weight loss, no weight gain, + fatigue.  HEENT: No blurry vision, no diplopia, no pharyngitis, no dysphagia CV: No chest pain, no palpitations, no PND, no orthopnea, no edema.  Resp: No SOB, + "smokers" cough, no pleuritic pain. GI: + nausea, no vomiting, + dry heaves, + diarrhea, no melena, + hematochezia related to bleeding hemorrhoids, no constipation, no abdominal pain.  GU: No dysuria, no hematuria, no frequency, no urgency. MSK: no myalgias, no arthralgias.  Neuro:  No headache, no focal neurological deficits, no history of seizures.  Psych: No depression, + anxiety.  Endo: No heat intolerance, no cold intolerance, no polyuria, no polydipsia, LMP: End of November  Skin: No rashes, + skin lesions.  Heme: No easy bruising.  Travel history: No recent travel  except to [place]   Past Medical History:   Past Medical History  Diagnosis Date  . Asthma   . Pseudotumor cerebri   . Tobacco abuse   . Abscess     Past Surgical History:   Past Surgical History  Procedure Laterality Date  . Cesarean section      x2    Social History:   History   Social History  . Marital Status: Single    Spouse Name: N/A    Number of Children: 2  . Years of Education: HS   Occupational History  . Disabled. Other    n/a   Social History Main Topics  . Smoking status: Current Every Day Smoker -- 0.75 packs/day for 20 years    Types: Cigarettes  . Smokeless tobacco: Never Used  . Alcohol Use: Yes     Comment: socially  . Drug Use: Yes    Special: Marijuana     Comment: occasionally  . Sexual Activity: Not on file   Other Topics Concern  . Not on file   Social History Narrative   Single.  Lives with her 2 children, aged 817 and 312.  Independent of ADLs.  Disabled from anxiety.    Family history:   Family History  Problem Relation Age of Onset  . Pulmonary embolism Mother   . Pseudotumor cerebri Mother   . Heart attack Father   . Blindness Other     Allergies  Aspirin; Shellfish allergy; Adhesive; Iodine; Kiwi extract; and Latex  Current Medications:   Prior to Admission medications   Medication Sig Start Date End Date Taking? Authorizing Provider  albuterol (PROVENTIL HFA;VENTOLIN HFA) 108 (90 BASE) MCG/ACT inhaler Inhale 2 puffs into the lungs every 6 (six) hours as needed for wheezing or shortness of breath.   Yes Historical Provider, MD  gabapentin (NEURONTIN) 100 MG capsule Take 100 mg by mouth 2 (two) times daily.   Yes Historical Provider, MD  latanoprost (XALATAN) 0.005 % ophthalmic solution Place 1 drop into both eyes at bedtime.   Yes Historical Provider, MD  topiramate (TOPAMAX) 50 MG tablet Take 2 tablets (100 mg total) by mouth 2 (two) times daily. 06/15/14  Yes Suanne MarkerVikram R Penumalli, MD    Physical Exam:   Filed  Vitals:   07/02/14 1518 07/02/14 1546 07/02/14 1558 07/02/14 1632  BP: 87/56     Pulse: 122 110    Temp:   100 F (37.8 C)   TempSrc:   Oral   Resp: 24 25    Height:    5\' 2"  (1.575 m)  Weight:    120.657 kg (266 lb)  SpO2: 94% 98%       Physical Exam: Blood pressure 87/56, pulse 110, temperature 100 F (37.8 C), temperature source Oral, resp. rate 25, height 5\' 2"  (1.575 m), weight 120.657 kg (266 lb), SpO2 98 %. Gen: No acute distress. Head: Normocephalic, atraumatic. Eyes: PERRL, EOMI, sclerae nonicteric. Mouth: Oropharynx clear.   Neck: Supple, no thyromegaly, no lymphadenopathy, no jugular venous distention. Chest: Lungs course with mild wheezing. CV: Heart sounds are tachy/regular.  No M/R/G. Abdomen: Soft, nontender, nondistended with normal active bowel sounds. Extremities: Extremities are without C/E/C. Skin: Warm and dry.  Indurated area right upper buttock in intergluteal fold with 1 cm incision, draining serosanguinous drainage.  Very tender. Neuro: Alert and oriented times 3; cranial nerves II through XII grossly intact. Psych: Mood and affect anxious.   Data Review:    Labs: Basic Metabolic Panel:  Recent Labs Lab 07/02/14 1435  NA 141  K 3.7  CL 102  CO2 22  GLUCOSE 117*  BUN 7  CREATININE 0.97  CALCIUM 9.0   CBC:  Recent Labs Lab 07/02/14 1435  WBC 7.1  HGB 10.3*  HCT 34.9*  MCV 76.7*  PLT 295    Radiographic Studies: No results found.  EKG: Pending.   Assessment/Plan:   Principal Problem:   Sepsis due to cellulitis, right buttock  Continue empiric Vanc/Zosyn, which was started in ER.  F/U culture of drainage, blood culture.  Wound care per RN.  IVF to restore BP.  Active Problems:   Pseudotumor cerebri  Continue Topamax.    Severe obesity (BMI >= 40)  Dietician consult.    Tobacco abuse  Tobacco cessation counseling.  Nicotine patch.    Microcytic anemia  Likely from menstrual losses.  No further work  up needed.  Consider iron replacement at discharge.    Vasovagal near syncope  IVF ordered.  Monitor hemodynamic status.    DVT prophylaxis  Lovenox ordered.  Code Status: Full. Family Communication: No family at the bedside. Disposition Plan: Home when stable.  Time spent: 65 minutes.  RAMA,CHRISTINA [facility] Pager [phone] Cell: [phone]   If 7PM-7AM, please contact night-coverage [email or web address] Password [email or web address] 07/02/2014, 4:50 PM

## 2014-07-02 NOTE — ED Notes (Signed)
Bed: WA20 Expected date:  Expected time:  Means of arrival:  Comments: EMS-near syncope

## 2014-07-02 NOTE — ED Provider Notes (Signed)
CSN: [id number]     Arrival date & time 07/02/14  1343 History   First MD Initiated Contact with Patient 07/02/14 1503     Chief Complaint  Patient presents with  . Near Syncope     (Consider location/radiation/quality/duration/timing/severity/associated sxs/prior Treatment) HPI Comments: Patient with PMH of anemia, asthma and pseudotumor cerebri presents to the ED with a chief complaint of near syncope.  Patient states that she had an abscessed drained earlier this morning.  She states that the abscess had been progressively worsening for the past week.  She has not tried taking anything for the symptoms.  She states that she had a panic attach while having the abscess drained.  She states that afterward, when she had gone to the pharmacy to have her medications filled, she felt weak and lightheaded.  She did not fall.  She now complains of persistent pain from the abscess, but denies any pain elsewhere.  She denies any CP, SOB.  She still feels very anxious.  The history is provided by the patient. No language interpreter was used.    Past Medical History  Diagnosis Date  . Asthma   . Pseudotumor cerebri    History reviewed. No pertinent past surgical history. Family History  Problem Relation Age of Onset  . Pulmonary embolism Mother   . Pseudotumor cerebri Mother   . Heart attack Father   . Blindness Other    History  Substance Use Topics  . Smoking status: Current Every Day Smoker -- 0.75 packs/day for 20 years    Types: Cigarettes  . Smokeless tobacco: Never Used  . Alcohol Use: Yes     Comment: socially   OB History    No data available     Review of Systems  Constitutional: Negative for fever and chills.  Respiratory: Negative for shortness of breath and stridor.   Cardiovascular: Negative for chest pain.  Gastrointestinal: Negative for nausea, vomiting, diarrhea and constipation.  Genitourinary: Negative for dysuria.  Skin: Positive for color change and wound.   All other systems reviewed and are negative.     Allergies  Aspirin; Shellfish allergy; Adhesive; Iodine; Kiwi extract; and Latex  Home Medications   Prior to Admission medications   Medication Sig Start Date End Date Taking? Authorizing Provider  albuterol (PROVENTIL HFA;VENTOLIN HFA) 108 (90 BASE) MCG/ACT inhaler Inhale 2 puffs into the lungs every 6 (six) hours as needed for wheezing or shortness of breath.   Yes Historical Provider, MD  gabapentin (NEURONTIN) 100 MG capsule Take 100 mg by mouth 2 (two) times daily.   Yes Historical Provider, MD  latanoprost (XALATAN) 0.005 % ophthalmic solution Place 1 drop into both eyes at bedtime.   Yes Historical Provider, MD  topiramate (TOPAMAX) 50 MG tablet Take 2 tablets (100 mg total) by mouth 2 (two) times daily. 06/15/14  Yes Vikram R Penumalli, MD   BP 120/87 mmHg  Pulse 120  Temp(Src) 97.7 F (36.5 C) (Oral)  Resp 22  SpO2 99% Physical Exam  Constitutional: She is oriented to person, place, and time. She appears well-developed and well-nourished.  HENT:  Head: Normocephalic and atraumatic.  Eyes: Conjunctivae and EOM are normal. Pupils are equal, round, and reactive to light.  Neck: Normal range of motion. Neck supple.  Cardiovascular: Regular rhythm.  Exam reveals no gallop and no friction rub.   No murmur heard. tachycardic  Pulmonary/Chest: Effort normal and breath sounds normal. No respiratory distress. She has no wheezes. She has no rales. She  exhibits no tenderness.  Abdominal: Soft. Bowel sounds are normal. She exhibits no distension and no mass. There is no tenderness. There is no rebound and no guarding.  Musculoskeletal: Normal range of motion. She exhibits no edema or tenderness.  Neurological: She is alert and oriented to person, place, and time.  Skin: Skin is warm and dry.  5x5 area of cellulitis to the right buttock with prior open incision, no purulent drainage  Psychiatric: She has a normal mood and affect.  Her behavior is normal. Judgment and thought content normal.  Nursing note and vitals reviewed.   ED Course  Procedures (including critical care time) Labs Review Labs Reviewed  CBC - Abnormal; Notable for the following:    Hemoglobin 10.3 (*)    HCT 34.9 (*)    MCV 76.7 (*)    MCH 22.6 (*)    MCHC 29.5 (*)    RDW 18.1 (*)    All other components within normal limits  BASIC METABOLIC PANEL - Abnormal; Notable for the following:    Glucose, Bld 117 (*)    GFR calc non Af Amer 74 (*)    GFR calc Af Amer 86 (*)    Anion gap 17 (*)    All other components within normal limits  CULTURE, BLOOD (ROUTINE X 2)  CULTURE, BLOOD (ROUTINE X 2)  WOUND CULTURE  I-STAT CG4 LACTIC ACID, ED  Lactic acid 2.38  Imaging Review No results found.   EKG Interpretation None      MDM   Final diagnoses:  Sepsis due to cellulitis    Patient with episode of near syncope.  Thought to be attributed to pain vs vasovagul from abscess I&D this morning.  However, on arrival the patient is quite tachycardic.  Initial BPs are low, but I suspect these are not reliable given patient's body habitus.  BP cuff switched, and BPs are stable.  Repeat vitals, BP still soft.  Still tachycardic, and now with low grade temp.  Patient seen by and discussed with Dr. Donnald GarrePfeiffer.  Will start IV abx.  Lactic acid is 2.38.  Blood cultures pending.  Patient has received 2 L of fluid.    Roxy Horsemanobert Courtnee Myer, PA-C 07/02/14 1619  Arby BarretteMarcy Pfeiffer, MD 07/03/14 [phone]

## 2014-07-02 NOTE — Progress Notes (Signed)
ANTIBIOTIC CONSULT NOTE - INITIAL  Pharmacy Consult for Vancomycin and Zosyn Indication: Cellulitis  Allergies  Allergen Reactions  . Aspirin Anaphylaxis  . Shellfish Allergy Anaphylaxis  . Adhesive [Tape] Rash and Other (See Comments)    bandaids tear skin off  . Iodine Swelling and Rash  . Kiwi Extract Rash    Reaction: rash around mouth, with tongue burning  . Latex Hives    Patient Measurements: Height: 5\' 2"  (157.5 cm) Weight: 266 lb (120.657 kg) IBW/kg (Calculated) : 50.1  Vital Signs: Temp: 100 F (37.8 C) (12/04 1558) Temp Source: Oral (12/04 1558) BP: 87/56 mmHg (12/04 1518) Pulse Rate: 110 (12/04 1546) Intake/Output from previous day:   Intake/Output from this shift:    Labs:  Recent Labs  07/02/14 1435  WBC 7.1  HGB 10.3*  PLT 295  CREATININE 0.97   Estimated Creatinine Clearance: 99.1 mL/min (by C-G formula based on Cr of 0.97). No results for input(s): VANCOTROUGH, VANCOPEAK, VANCORANDOM, GENTTROUGH, GENTPEAK, GENTRANDOM, TOBRATROUGH, TOBRAPEAK, TOBRARND, AMIKACINPEAK, AMIKACINTROU, AMIKACIN in the last 72 hours.   Microbiology: No results found for this or any previous visit (from the past 720 hour(s)).  Medical History: Past Medical History  Diagnosis Date  . Asthma   . Pseudotumor cerebri   . Tobacco abuse   . Abscess     Medications:  Scheduled:   Infusions:  . piperacillin-tazobactam 3.375 g (07/02/14 1632)  . vancomycin     PRN:   Assessment: 38 yo female with anemia, asthma and pseudotumor cerebri presents to ED with near syncope. She had an abscess drained this AM developed lighteheadedness and weakness. Pharmacy is consulted to dose vancomycin and zosyn for treatment of cellulitis.  12/4 >> Vancomycin >> 12/4 >> Zosyn >>  Tmax: 100 WBC: 7.1k SCr: SCr 0.97, CrCl 90 N  12/4 blood: 12/4 wound:  Goal of Therapy:  Vancomycin trough level 10-15 mcg/ml  Zosyn dose per renal function  Plan:   Vancomycin 2.5g IV x 1,  then 1250mg  IV q12h Check trough at steady state Zosyn 3.375gm IV q8h (4hr extended infusions) Follow up renal function & cultures, clinical course, de-escalation  Loralee PacasErin Manar Smalling, PharmD, BCPS Pager: [phone] 07/02/2014,4:34 PM

## 2014-07-02 NOTE — ED Notes (Signed)
Per EMS: Pt has abscess removed from buttocks, pt had panic attack when lidocaine injected. This happened this morning. Pt was at [employer] and started feeling lightheaded and weak .

## 2014-07-02 NOTE — Plan of Care (Signed)
Problem: Phase I Progression Outcomes Goal: Pain controlled with appropriate interventions Outcome: Completed/Met Date Met:  07/02/14 Goal: OOB as tolerated unless otherwise ordered Outcome: Completed/Met Date Met:  07/02/14 Goal: Voiding-avoid urinary catheter unless indicated Outcome: Completed/Met Date Met:  07/02/14     

## 2014-07-02 NOTE — ED Notes (Signed)
Attempted calling report to 3W. Will call back in 15 minutes.

## 2014-07-03 LAB — CG4 I-STAT (LACTIC ACID): LACTIC ACID, VENOUS: 2.38 mmol/L — AB (ref 0.5–2.2)

## 2014-07-03 MED ORDER — SULFAMETHOXAZOLE-TRIMETHOPRIM 800-160 MG PO TABS
1.0000 | ORAL_TABLET | Freq: Two times a day (BID) | ORAL | Status: DC
  Administered 2014-07-03: 1 via ORAL
  Filled 2014-07-03 (×2): qty 1

## 2014-07-03 MED ORDER — HYDROCODONE-ACETAMINOPHEN 5-325 MG PO TABS: 1.0000 | ORAL_TABLET | ORAL | Status: DC | PRN

## 2014-07-03 MED ORDER — SULFAMETHOXAZOLE-TRIMETHOPRIM 800-160 MG PO TABS: 1.0000 | ORAL_TABLET | Freq: Two times a day (BID) | ORAL | Status: DC

## 2014-07-03 MED ORDER — ACETAMINOPHEN 325 MG PO TABS: 650.0000 mg | ORAL_TABLET | Freq: Four times a day (QID) | ORAL | Status: AC | PRN

## 2014-07-03 MED ORDER — DOXYCYCLINE HYCLATE 100 MG PO TABS: 100.0000 mg | ORAL_TABLET | Freq: Two times a day (BID) | ORAL | Status: DC

## 2014-07-03 NOTE — Discharge Summary (Signed)
Physician Discharge Summary  Jasmin ChurnCarol A Gatlin ZOX:[id number] DOB: 11-01-1975 DOA: 07/02/2014  PCP: Pcp Not In System Dr. Lerry Linerwight Williams  Admit date: 07/02/2014 Discharge date: 07/03/2014   Recommendations for Outpatient Follow-Up:   1. Instructed to take Bactrim x 10 days (had already been prescribed by Dr. Mayford KnifeWilliams) and to hold on to doxycycline prescription (also prescribed by Dr. Mayford KnifeWilliams) after reviewing spectrum of activity with pharmacy.   2. F/U with PCP for final wound culture and blood culture results.   Discharge Diagnosis:   Principal Problem:    Sepsis due to cellulitis Active Problems:    Pseudotumor cerebri    Severe obesity (BMI >= 40)    Cellulitis and abscess, right buttock    Tobacco abuse    Microcytic anemia    Vasovagal near syncope   Discharge Condition: Improved.  Diet recommendation: Low sodium, heart healthy.     History of Present Illness:   Jasmin Soto is an 38 y.o. female with a PMH of asthma, pseudotumor cerebri, tobacco abuse and soft tissue infection/abscess requiring lancing who was admitted with cellulitis/sepsis 07/02/14 after having an abscess on her right buttock drained by her PCP.  Hospital Course by Problem:   Principal Problem:  Sepsis due to cellulitis, right buttock  Treated with empiric Vanc/Zosyn, which was started in ER.  F/U culture of drainage, blood culture.  Wound care per RN.  Blood pressure stable status post fluid volume resuscitation.  Felt dramatically better this a.m. And requested d/c home.  Active Problems:  Pseudotumor cerebri  Continue Topamax.   Severe obesity (BMI >= 40)  Dietician consulted.  Outpatient diet education set up.   Tobacco abuse  Tobacco cessation counseling performed.  Nicotine patch provided.   Microcytic anemia  Likely from menstrual losses.  No further work up needed.   Vasovagal near syncope  IVF ordered. Hemodynamically stable at D/C.    Medical  Consultants:    None.   Discharge Exam:   Filed Vitals:   07/03/14 0519  BP: 119/75  Pulse: 91  Temp: 98.2 F (36.8 C)  Resp: 20   Filed Vitals:   07/02/14 1700 07/02/14 1815 07/02/14 2049 07/03/14 0519  BP: 92/62 111/63 101/64 119/75  Pulse: 112 108 109 91  Temp:  98.3 F (36.8 C) 98.1 F (36.7 C) 98.2 F (36.8 C)  TempSrc:  Oral Oral Oral  Resp: 27 20 22 20   Height:  5\' 2"  (1.575 m)    Weight:  121.1 kg (266 lb 15.6 oz)    SpO2: 94% 100% 100% 99%    Gen:  NAD Cardiovascular:  RRR, No M/R/G Respiratory: Lungs CTAB Gastrointestinal: Abdomen soft, NT/ND with normal active bowel sounds. Extremities: No C/E/C   The results of significant diagnostics from this hospitalization (including imaging, microbiology, ancillary and laboratory) are listed below for reference.     Procedures and Diagnostic Studies:   None   Labs:   Basic Metabolic Panel:  Recent Labs Lab 07/02/14 1435  NA 141  K 3.7  CL 102  CO2 22  GLUCOSE 117*  BUN 7  CREATININE 0.97  CALCIUM 9.0   GFR Estimated Creatinine Clearance: 99.4 mL/min (by C-G formula based on Cr of 0.97).  CBC:  Recent Labs Lab 07/02/14 1435  WBC 7.1  HGB 10.3*  HCT 34.9*  MCV 76.7*  PLT 295   Microbiology Recent Results (from the past 240 hour(s))  Blood culture (routine x 2)     Status: None (Preliminary result)  Collection Time: 07/02/14  3:18 PM  Result Value Ref Range Status   Specimen Description BLOOD LEFT ANTECUBITAL  Final   Special Requests BOTTLES DRAWN AEROBIC AND ANAEROBIC 5CC  Final   Culture  Setup Time   Final    07/02/2014 17:34 Performed at [facility]    Culture   Final           BLOOD CULTURE RECEIVED NO GROWTH TO DATE CULTURE WILL BE HELD FOR 5 DAYS BEFORE ISSUING A FINAL NEGATIVE REPORT Performed at [facility]    Report Status PENDING  Incomplete  Blood culture (routine x 2)     Status: None (Preliminary result)   Collection Time: 07/02/14  3:39 PM    Result Value Ref Range Status   Specimen Description BLOOD RIGHT ANTECUBITAL  Final   Special Requests BOTTLES DRAWN AEROBIC AND ANAEROBIC 5CC  Final   Culture  Setup Time   Final    07/02/2014 22:26 Performed at [facility]    Culture   Final           BLOOD CULTURE RECEIVED NO GROWTH TO DATE CULTURE WILL BE HELD FOR 5 DAYS BEFORE ISSUING A FINAL NEGATIVE REPORT Performed at [facility]    Report Status PENDING  Incomplete  Wound culture     Status: None (Preliminary result)   Collection Time: 07/02/14  4:20 PM  Result Value Ref Range Status   Specimen Description ABSCESS  Final   Special Requests NONE  Final   Gram Stain   Final    FEW WBC PRESENT,BOTH PMN AND MONONUCLEAR NO SQUAMOUS EPITHELIAL CELLS SEEN FEW GRAM POSITIVE COCCI IN PAIRS IN CLUSTERS Performed at [facility]    Culture   Final    NO GROWTH 1 DAY Performed at [facility]    Report Status PENDING  Incomplete     Discharge Instructions:   Discharge Instructions    Call MD for:  extreme fatigue    Complete by:  As directed      Call MD for:  persistant nausea and vomiting    Complete by:  As directed      Call MD for:  severe uncontrolled pain    Complete by:  As directed      Call MD for:  temperature >100.4    Complete by:  As directed      Diet - low sodium heart healthy    Complete by:  As directed      Discharge instructions    Complete by:  As directed   Call your doctor if you have any problems with high fever, dizziness, feeling feint.  [facility]  Dr. Mayford KnifeWilliams ordered 2 different antibiotics to take, however these antibiotics provide similar coverage, so you only need to take 1.  Take the Septra for 10 days, but keep the doxycycline on hand in case the abscess does not completely resolve using the Septra.     Increase activity slowly    Complete by:  As directed             Medication List    TAKE  these medications        acetaminophen 325 MG tablet  Commonly known as:  TYLENOL  Take 2 tablets (650 mg total) by mouth every 6 (six) hours as needed for mild pain (or Fever >/= 101).     albuterol 108 (90 Soto)  MCG/ACT inhaler  Commonly known as:  PROVENTIL HFA;VENTOLIN HFA  Inhale 2 puffs into the lungs every 6 (six) hours as needed for wheezing or shortness of breath.     gabapentin 100 MG capsule  Commonly known as:  NEURONTIN  Take 100 mg by mouth 2 (two) times daily.     HYDROcodone-acetaminophen 5-325 MG per tablet  Commonly known as:  NORCO/VICODIN  Take 1-2 tablets by mouth every 4 (four) hours as needed for moderate pain.     latanoprost 0.005 % ophthalmic solution  Commonly known as:  XALATAN  Place 1 drop into both eyes at bedtime.     sulfamethoxazole-trimethoprim 800-160 MG per tablet  Commonly known as:  BACTRIM DS,SEPTRA DS  Take 1 tablet by mouth every 12 (twelve) hours.     topiramate 50 MG tablet  Commonly known as:  TOPAMAX  Take 2 tablets (100 mg total) by mouth 2 (two) times daily.           Follow-up Information    Follow up with Dr. Lerry Liner. Schedule an appointment as soon as possible for a visit in 2 weeks.   Why:  Hospital follow up of your skin infection.       Time coordinating discharge: 25 minutes.  Signed:  Meyah Corle  Pager [phone] [facility] 07/03/2014, 12:41 PM

## 2014-07-03 NOTE — Discharge Instructions (Signed)

## 2014-07-06 LAB — WOUND CULTURE

## 2014-07-08 LAB — CULTURE, BLOOD (ROUTINE X 2)
CULTURE: NO GROWTH
CULTURE: NO GROWTH

## 2014-07-14 ENCOUNTER — Ambulatory Visit: Payer: Medicaid Other | Admitting: Dietician

## 2014-08-19 ENCOUNTER — Ambulatory Visit: Payer: Medicaid Other | Admitting: Dietician

## 2014-09-15 ENCOUNTER — Encounter: Payer: Self-pay | Admitting: Diagnostic Neuroimaging

## 2014-09-15 ENCOUNTER — Ambulatory Visit (INDEPENDENT_AMBULATORY_CARE_PROVIDER_SITE_OTHER): Payer: Medicaid Other | Admitting: Diagnostic Neuroimaging

## 2014-09-15 VITALS — BP 119/78 | HR 92 | Ht 62.0 in | Wt 264.2 lb

## 2014-09-15 DIAGNOSIS — M5416 Radiculopathy, lumbar region: Secondary | ICD-10-CM

## 2014-09-15 DIAGNOSIS — G932 Benign intracranial hypertension: Secondary | ICD-10-CM

## 2014-09-15 NOTE — Progress Notes (Signed)
[facility]  PATIENT: Jasmin Soto DOB: October 09, 1975  REFERRING CLINICIAN:  HISTORY FROM: patient  REASON FOR VISIT: follow up    HISTORICAL  CHIEF COMPLAINT:  Chief Complaint  Patient presents with  . Follow-up    psuedotumor cerebri    HISTORY OF PRESENT ILLNESS:   UPDATE 09/15/14: Since last visit, was in hosp for sepsis following buttock abscess aspiration. HA continue. Also with new low back pain radiating to left leg x 1 month.   UPDATE 06/15/14: Since last visit, still has not seen Dr. Dione Booze or PCP. Had LP (opening pressure 35, closing pressure 10).   UPDATE 04/15/14: Since last visit, lost to follow up due to family, financial issues. HA are worsening. Had transient visual obscuration a few weeks ago, lasting 1-2 hours.  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 Will proceed with IIH (pseudotumor cerebri) evaluation. HA still bad. Arm pain still bad. EMG and labs normal. Insurance denied her MRI studies that I ordered last visit.  PRIOR HPI (05/27/12): 39 year old right-handed female history of hypertension, asthma, depression, anxiety, here for evaluation of headache, neck pain, numbness and tingling since November 2013. Patient describes sudden onset pain in her neck, shoulders and head. She had some photophobia and throbbing headache pain. No nausea vomiting. She also felt numbness and tingling going into her arms and fingers. Symptoms most severe in her left hand, digits 4 and 5. Patient went to the emergency room by ambulance, had MRI of the neck which was unremarkable. Since that time symptoms have subsided a little bit. She has had intermittent weakness in her left hand. Also patient notes insidious onset of decreased vision in her left eye.   REVIEW OF SYSTEMS: Full 14 system review of systems performed and notable only for fatigue light sens eye itching incr thirst diarrhea insomnia snoring back pain depression anxiety  numbness headaches dizziness passing out anemia.    ALLERGIES: Allergies  Allergen Reactions  . Aspirin Anaphylaxis  . Shellfish Allergy Anaphylaxis  . Adhesive [Tape] Rash and Other (See Comments)    bandaids tear skin off  . Iodine Swelling and Rash  . Kiwi Extract Rash    Reaction: rash around mouth, with tongue burning  . Latex Hives    HOME MEDICATIONS: Outpatient Prescriptions Prior to Visit  Medication Sig Dispense Refill  . acetaminophen (TYLENOL) 325 MG tablet Take 2 tablets (650 mg total) by mouth every 6 (six) hours as needed for mild pain (or Fever >/= 101).    . gabapentin (NEURONTIN) 100 MG capsule Take 100 mg by mouth 2 (two) times daily.    Marland Kitchen latanoprost (XALATAN) 0.005 % ophthalmic solution Place 1 drop into both eyes at bedtime.    . topiramate (TOPAMAX) 50 MG tablet Take 2 tablets (100 mg total) by mouth 2 (two) times daily. 120 tablet 12  . albuterol (PROVENTIL HFA;VENTOLIN HFA) 108 (90 BASE) MCG/ACT inhaler Inhale 2 puffs into the lungs every 6 (six) hours as needed for wheezing or shortness of breath.    Marland Kitchen HYDROcodone-acetaminophen (NORCO/VICODIN) 5-325 MG per tablet Take 1-2 tablets by mouth every 4 (four) hours as needed for moderate pain. 30 tablet 0  . sulfamethoxazole-trimethoprim (BACTRIM DS,SEPTRA DS) 800-160 MG per tablet Take 1 tablet by mouth every 12 (twelve) hours.     No facility-administered medications prior to visit.    PAST MEDICAL HISTORY: Past Medical History  Diagnosis Date  . Asthma   . Pseudotumor cerebri   . Tobacco  abuse   . Abscess     PAST SURGICAL HISTORY: Past Surgical History  Procedure Laterality Date  . Cesarean section      x2    FAMILY HISTORY: Family History  Problem Relation Age of Onset  . Pulmonary embolism Mother   . Pseudotumor cerebri Mother   . Heart attack Father   . Blindness Other     SOCIAL HISTORY:  History   Social History  . Marital Status: Single    Spouse Name: N/A  . Number of  Children: 2  . Years of Education: HS   Occupational History  . Disabled. Other    n/a   Social History Main Topics  . Smoking status: Current Every Day Smoker -- 0.75 packs/day for 20 years    Types: Cigarettes  . Smokeless tobacco: Never Used  . Alcohol Use: Yes     Comment: socially  . Drug Use: Yes    Special: Marijuana     Comment: occasionally  . Sexual Activity: Not on file   Other Topics Concern  . Not on file   Social History Narrative   Single.  Lives with her 2 children, aged 37 and 9012.  Independent of ADLs.  Disabled from anxiety.     PHYSICAL EXAM  Filed Vitals:   09/15/14 1258  BP: 119/78  Pulse: 92  Height: 5\' 2"  (1.575 m)  Weight: 264 lb 3.2 oz (119.84 kg)    Not recorded     Wt Readings from Last 3 Encounters:  09/15/14 264 lb 3.2 oz (119.84 kg)  07/02/14 266 lb 15.6 oz (121.1 kg)  06/15/14 268 lb 3.2 oz (121.655 kg)   Body mass index is 48.31 kg/(m^2).  GENERAL EXAM: Patient is in no distress; well developed, nourished and groomed; neck is supple  CARDIOVASCULAR: Regular rate and rhythm, no murmurs, no carotid bruits  NEUROLOGIC: MENTAL STATUS: awake, alert, language fluent, comprehension intact, naming intact, fund of knowledge appropriate CRANIAL NERVE: PHOTOSENSITIVE; SLIGHTLY BLURRED DISC MARGINS, pupils equal and reactive to light, visual fields full to confrontation, extraocular muscles intact, no nystagmus, facial sensation and strength symmetric, hearing intact, palate elevates symmetrically, uvula midline, shoulder shrug symmetric, tongue midline. MOTOR: normal bulk and tone, full strength in the BUE, BLE SENSORY: normal and symmetric to light touch, pinprick, temperature, vibration COORDINATION: finger-nose-finger, fine finger movements normal REFLEXES: deep tendon reflexes present and symmetric GAIT/STATION: narrow based gait; romberg is negative    DIAGNOSTIC DATA (LABS, IMAGING, TESTING) - I reviewed patient records, labs,  notes, testing and imaging myself where available.  Lab Results  Component Value Date   WBC 7.1 07/02/2014   HGB 10.3* 07/02/2014   HCT 34.9* 07/02/2014   MCV 76.7* 07/02/2014   PLT 295 07/02/2014      Component Value Date/Time   NA 141 07/02/2014 1435   K 3.7 07/02/2014 1435   CL 102 07/02/2014 1435   CO2 22 07/02/2014 1435   GLUCOSE 117* 07/02/2014 1435   BUN 7 07/02/2014 1435   CREATININE 0.97 07/02/2014 1435   CALCIUM 9.0 07/02/2014 1435   GFRNONAA 74* 07/02/2014 1435   GFRAA 86* 07/02/2014 1435   No results found for: CHOL No results found for: HGBA1C No results found for: VITAMINB12 No results found for: TSH  I reviewed images myself and agree with interpretation. -VRP  09/04/12 MRI BRAIN  1. Partially empty sella and slightly enlarged optic nerve sheaths. Non-specific finding, but can be seen in association with idiopathic intracranial hypertension (IIH or  pseudotumor cerebri). 2. Remainder of brain parenchyma unremarkable.  10/04/12 MRV head - Normal MRV head (without contrast). Left side dominant and right side hypoplastic venous drainage pattern may be a normal variant.  04/29/14 LP 1. Technically successful lumbar puncture under fluoroscopy. 2. Elevated opening pressure (35cm H2O), normalized after large volume tap (down to 10cm H2O).    ASSESSMENT AND PLAN  39 y.o. year old female here with pseudotumor cerebri (HA, papilledema, obesity), stable. However had abscess, sepsis hospitalization recently. Alos with low back pain radiating to left side. These are also likely complications of obesity and poor overall health. Many barriers (family, personal, psychosocial) to her improvement in care.    PLAN: - weight loss advice reviewed; she declines bariatric surgery evaluation - continue topiramate to  BID - serial eye exams with Dr. Dione Booze - PT eval for low back pain  Orders Placed This Encounter  Procedures  . Ambulatory referral to Physical Therapy    Return in about 6 months (around 03/16/2015).    Suanne Marker, MD 09/15/2014, 1:25 PM Certified in Neurology, Neurophysiology and Neuroimaging  [facility] [address] [place], [place] [address] [phone]

## 2014-09-15 NOTE — Patient Instructions (Signed)
Continue current medications. 

## 2014-10-12 ENCOUNTER — Ambulatory Visit: Payer: Medicaid Other

## 2015-03-15 ENCOUNTER — Ambulatory Visit (INDEPENDENT_AMBULATORY_CARE_PROVIDER_SITE_OTHER): Payer: Medicaid Other | Admitting: Diagnostic Neuroimaging

## 2015-03-15 ENCOUNTER — Encounter: Payer: Self-pay | Admitting: Diagnostic Neuroimaging

## 2015-03-15 VITALS — BP 128/90 | HR 91 | Ht 62.0 in | Wt 260.2 lb

## 2015-03-15 DIAGNOSIS — G43009 Migraine without aura, not intractable, without status migrainosus: Secondary | ICD-10-CM

## 2015-03-15 DIAGNOSIS — Z79899 Other long term (current) drug therapy: Secondary | ICD-10-CM

## 2015-03-15 DIAGNOSIS — G932 Benign intracranial hypertension: Secondary | ICD-10-CM | POA: Diagnosis not present

## 2015-03-15 MED ORDER — TOPIRAMATE 100 MG PO TABS: 100.0000 mg | ORAL_TABLET | Freq: Three times a day (TID) | ORAL | Status: DC

## 2015-03-15 NOTE — Patient Instructions (Signed)
Increase topiramate up to  in AM and  in PM.

## 2015-03-15 NOTE — Progress Notes (Signed)
[facility]  PATIENT: Jasmin Soto DOB: 02/04/1976  REFERRING CLINICIAN:  HISTORY FROM: patient  REASON FOR VISIT: follow up    HISTORICAL  CHIEF COMPLAINT:  Chief Complaint  Patient presents with  . Pseudotumor cerebri    rm 7, "migraines"  . Follow-up    6 mos    HISTORY OF PRESENT ILLNESS:   UPDATE 03/15/15: Since last visit, HA have increased in last 1 month. More vision changes, sounds in head, HA pain. Has not seen Dr. Dione Booze in a while.   UPDATE 09/15/14: Since last visit, was in hosp for sepsis following buttock abscess aspiration. HA continue. Also with new low back pain radiating to left leg x 1 month.   UPDATE 06/15/14: Since last visit, still has not seen Dr. Dione Booze or PCP. Had LP (opening pressure 35, closing pressure 10).   UPDATE 04/15/14: Since last visit, lost to follow up due to family, financial issues. HA are worsening. Had transient visual obscuration a few weeks ago, lasting 1-2 hours.  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 Will proceed with IIH (pseudotumor cerebri) evaluation. HA still bad. Arm pain still bad. EMG and labs normal. Insurance denied her MRI studies that I ordered last visit.  PRIOR HPI (05/27/12): 39 year old right-handed female history of hypertension, asthma, depression, anxiety, here for evaluation of headache, neck pain, numbness and tingling since November 2013. Patient describes sudden onset pain in her neck, shoulders and head. She had some photophobia and throbbing headache pain. No nausea vomiting. She also felt numbness and tingling going into her arms and fingers. Symptoms most severe in her left hand, digits 4 and 5. Patient went to the emergency room by ambulance, had MRI of the neck which was unremarkable. Since that time symptoms have subsided a little bit. She has had intermittent weakness in her left hand. Also patient notes insidious onset of decreased vision in her left  eye.   REVIEW OF SYSTEMS: Full 14 system review of systems performed and notable only for fatigue light sens eye itching incr thirst diarrhea insomnia snoring back pain depression anxiety numbness headaches dizziness passing out anemia depression anxiety.   ALLERGIES: Allergies  Allergen Reactions  . Aspirin Anaphylaxis  . Shellfish Allergy Anaphylaxis  . Adhesive [Tape] Rash and Other (See Comments)    bandaids tear skin off  . Iodine Swelling and Rash  . Kiwi Extract Rash    Reaction: rash around mouth, with tongue burning  . Latex Hives    HOME MEDICATIONS: Outpatient Prescriptions Prior to Visit  Medication Sig Dispense Refill  . acetaminophen (TYLENOL) 325 MG tablet Take 2 tablets (650 mg total) by mouth every 6 (six) hours as needed for mild pain (or Fever >/= 101).    Marland Kitchen albuterol (PROVENTIL HFA;VENTOLIN HFA) 108 (90 BASE) MCG/ACT inhaler Inhale 2 puffs into the lungs every 6 (six) hours as needed for wheezing or shortness of breath.    . topiramate (TOPAMAX) 50 MG tablet Take 2 tablets (100 mg total) by mouth 2 (two) times daily. 120 tablet 12  . gabapentin (NEURONTIN) 100 MG capsule Take 100 mg by mouth 2 (two) times daily.    Marland Kitchen latanoprost (XALATAN) 0.005 % ophthalmic solution Place 1 drop into both eyes at bedtime.     No facility-administered medications prior to visit.    PAST MEDICAL HISTORY: Past Medical History  Diagnosis Date  . Asthma   . Pseudotumor cerebri   . Tobacco abuse   . Abscess  PAST SURGICAL HISTORY: Past Surgical History  Procedure Laterality Date  . Cesarean section      x2    FAMILY HISTORY: Family History  Problem Relation Age of Onset  . Pulmonary embolism Mother   . Pseudotumor cerebri Mother   . Heart attack Father   . Blindness Other     SOCIAL HISTORY:  Social History   Social History  . Marital Status: Single    Spouse Name: N/A  . Number of Children: 2  . Years of Education: HS   Occupational History  .  Disabled. Other    n/a   Social History Main Topics  . Smoking status: Current Every Day Smoker -- 0.75 packs/day for 20 years    Types: Cigarettes  . Smokeless tobacco: Never Used  . Alcohol Use: Yes     Comment: socially  . Drug Use: Yes    Special: Marijuana     Comment: occasionally  . Sexual Activity: Not on file   Other Topics Concern  . Not on file   Social History Narrative   Single.  Lives with her 2 children, aged 26 and 76.  Independent of ADLs.  Disabled from anxiety.     PHYSICAL EXAM  Filed Vitals:   03/15/15 1249  BP: 128/90  Pulse: 91  Height: 5\' 2"  (1.575 m)  Weight: 260 lb 3.2 oz (118.026 kg)    Not recorded     Wt Readings from Last 3 Encounters:  03/15/15 260 lb 3.2 oz (118.026 kg)  09/15/14 264 lb 3.2 oz (119.84 kg)  07/02/14 266 lb 15.6 oz (121.1 kg)   Body mass index is 47.58 kg/(m^2).  GENERAL EXAM: Patient is in no distress; well developed, nourished and groomed; neck is supple; IN DARK ROOM; PERI-ORBITAL HYPER-PIGMENTATION ("DARK CIRCLES AROUND EYES"; NO MAKEUP)  CARDIOVASCULAR: Regular rate and rhythm, no murmurs, no carotid bruits  NEUROLOGIC: MENTAL STATUS: awake, alert, language fluent, comprehension intact, naming intact, fund of knowledge appropriate CRANIAL NERVE: PHOTOSENSITIVE; SLIGHTLY BLURRED DISC MARGINS, pupils equal and reactive to light, visual fields full to confrontation, extraocular muscles intact, no nystagmus, facial sensation and strength symmetric, hearing intact, palate elevates symmetrically, uvula midline, shoulder shrug symmetric, tongue midline. MOTOR: normal bulk and tone, full strength in the BUE, BLE SENSORY: normal and symmetric to light touch, pinprick, temperature, vibration COORDINATION: finger-nose-finger, fine finger movements normal REFLEXES: deep tendon reflexes present and symmetric GAIT/STATION: narrow based gait; romberg is negative    DIAGNOSTIC DATA (LABS, IMAGING, TESTING) - I reviewed  patient records, labs, notes, testing and imaging myself where available.  Lab Results  Component Value Date   WBC 7.1 07/02/2014   HGB 10.3* 07/02/2014   HCT 34.9* 07/02/2014   MCV 76.7* 07/02/2014   PLT 295 07/02/2014      Component Value Date/Time   NA 141 07/02/2014 1435   K 3.7 07/02/2014 1435   CL 102 07/02/2014 1435   CO2 22 07/02/2014 1435   GLUCOSE 117* 07/02/2014 1435   BUN 7 07/02/2014 1435   CREATININE 0.97 07/02/2014 1435   CALCIUM 9.0 07/02/2014 1435   GFRNONAA 74* 07/02/2014 1435   GFRAA 86* 07/02/2014 1435   No results found for: CHOL No results found for: HGBA1C No results found for: VITAMINB12 No results found for: TSH   09/04/12 MRI BRAIN  1. Partially empty sella and slightly enlarged optic nerve sheaths. Non-specific finding, but can be seen in association with idiopathic intracranial hypertension (IIH or pseudotumor cerebri). 2. Remainder of brain  parenchyma unremarkable.  10/04/12 MRV head - Normal MRV head (without contrast). Left side dominant and right side hypoplastic venous drainage pattern may be a normal variant.  04/29/14 LP 1. Technically successful lumbar puncture under fluoroscopy. 2. Elevated opening pressure (35cm H2O), normalized after large volume tap (down to 10cm H2O).    ASSESSMENT AND PLAN  39 y.o. year old female here with pseudotumor cerebri (HA, papilledema, obesity), slightly worse in last few months. Many barriers still exist (family, personal, psychosocial) to her improvement in care.   Dx:  Pseudotumor cerebri  Migraine without aura and without status migrainosus, not intractable    PLAN: - increase topiramate to 100mg  TID (or 100mg  in AM and 200mg  in PM) - follow up eye exam with Dr. Dione Booze; if worse papilledema, then will consider LP - weight loss advice reviewed; she declines bariatric surgery evaluation  Orders Placed This Encounter  Procedures  . TSH  . Hemoglobin A1c  . CBC with Differential/Platelet  .  Comprehensive metabolic panel   Meds ordered this encounter  Medications  . topiramate (TOPAMAX) 100 MG tablet    Sig: Take 1 tablet (100 mg total) by mouth 3 (three) times daily.    Dispense:  90 tablet    Refill:  12   Return in about 3 months (around 06/15/2015).    Suanne Marker, MD 03/15/2015, 1:13 PM Certified in Neurology, Neurophysiology and Neuroimaging  [facility] [address] [place], [place] [address] [phone]

## 2015-03-16 ENCOUNTER — Telehealth: Payer: Self-pay | Admitting: *Deleted

## 2015-03-16 LAB — COMPREHENSIVE METABOLIC PANEL
ALBUMIN: 4.1 g/dL (ref 3.5–5.5)
ALK PHOS: 95 IU/L (ref 39–117)
ALT: 13 IU/L (ref 0–32)
AST: 15 IU/L (ref 0–40)
Albumin/Globulin Ratio: 1.6 (ref 1.1–2.5)
BUN / CREAT RATIO: 15 (ref 8–20)
BUN: 11 mg/dL (ref 6–20)
CHLORIDE: 102 mmol/L (ref 97–108)
CO2: 24 mmol/L (ref 18–29)
CREATININE: 0.72 mg/dL (ref 0.57–1.00)
Calcium: 9.2 mg/dL (ref 8.7–10.2)
GFR calc Af Amer: 124 mL/min/{1.73_m2} (ref >59)
GFR calc non Af Amer: 107 mL/min/{1.73_m2} (ref >59)
GLUCOSE: 98 mg/dL (ref 65–99)
Globulin, Total: 2.5 g/dL (ref 1.5–4.5)
Potassium: 4.8 mmol/L (ref 3.5–5.2)
Sodium: 141 mmol/L (ref 134–144)
Total Protein: 6.6 g/dL (ref 6.0–8.5)

## 2015-03-16 LAB — HEMOGLOBIN A1C
ESTIMATED AVERAGE GLUCOSE: 126 mg/dL
HEMOGLOBIN A1C: 6 % — AB (ref 4.8–5.6)

## 2015-03-16 LAB — CBC WITH DIFFERENTIAL/PLATELET
BASOS ABS: 0 10*3/uL (ref 0.0–0.2)
Basos: 0 %
EOS (ABSOLUTE): 0.3 10*3/uL (ref 0.0–0.4)
Eos: 3 %
HEMOGLOBIN: 9.7 g/dL — AB (ref 11.1–15.9)
Hematocrit: 33.2 % — ABNORMAL LOW (ref 34.0–46.6)
Immature Grans (Abs): 0 10*3/uL (ref 0.0–0.1)
Immature Granulocytes: 0 %
LYMPHS ABS: 2.3 10*3/uL (ref 0.7–3.1)
Lymphs: 30 %
MCH: 21.7 pg — AB (ref 26.6–33.0)
MCHC: 29.2 g/dL — AB (ref 31.5–35.7)
MCV: 74 fL — ABNORMAL LOW (ref 79–97)
MONOCYTES: 6 %
Monocytes Absolute: 0.4 10*3/uL (ref 0.1–0.9)
Neutrophils Absolute: 4.7 10*3/uL (ref 1.4–7.0)
Neutrophils: 61 %
Platelets: 331 10*3/uL (ref 150–379)
RBC: 4.46 x10E6/uL (ref 3.77–5.28)
RDW: 18.9 % — AB (ref 12.3–15.4)
WBC: 7.7 10*3/uL (ref 3.4–10.8)

## 2015-03-16 LAB — TSH: TSH: 0.15 u[IU]/mL — ABNORMAL LOW (ref 0.450–4.500)

## 2015-03-16 NOTE — Telephone Encounter (Signed)
-----   Message from Suanne Marker, MD sent at 03/16/2015  9:30 AM EDT ----- pls call pt with results. Needs PCP follow up of anemia, borderline diabetes, and thyroid function abnormality. Kidney and liver function are good. -VRP

## 2015-03-16 NOTE — Telephone Encounter (Signed)
Spoke with patient and informed her of lab results per Dr Marjory Lies. Advised she call PCP, Dr Lerry Liner re: anemia, thyroid function and borderline diabetes. She verbalized understanding, agreement.

## 2015-06-16 ENCOUNTER — Encounter: Payer: Self-pay | Admitting: Diagnostic Neuroimaging

## 2015-06-16 ENCOUNTER — Ambulatory Visit (INDEPENDENT_AMBULATORY_CARE_PROVIDER_SITE_OTHER): Payer: Medicaid Other | Admitting: Diagnostic Neuroimaging

## 2015-06-16 VITALS — BP 135/91 | HR 90 | Ht 62.0 in | Wt 260.0 lb

## 2015-06-16 DIAGNOSIS — G932 Benign intracranial hypertension: Secondary | ICD-10-CM | POA: Diagnosis not present

## 2015-06-16 MED ORDER — TOPIRAMATE 100 MG PO TABS: 100.0000 mg | ORAL_TABLET | Freq: Two times a day (BID) | ORAL | Status: DC

## 2015-06-16 NOTE — Patient Instructions (Signed)
Thank you for coming to see [place] at [facility]. I hope we have been able to provide you high quality care today.  You may receive a patient satisfaction survey over the next few weeks. We would appreciate your feedback and comments so that we may continue to improve ourselves and the health of our patients.  - continue topiramate 158m twice a day - consider sleep study   ~~~~~~~~~~~~~~~~~~~~~~~~~~~~~~~~~~~~~~~~~~~~~~~~~~~~~~~~~~~~~~~~~  DR. Chassity Ludke'S GUIDE TO HAPPY AND HEALTHY LIVING These are some of my general health and wellness recommendations. Some of them may apply to you better than others. Please use common sense as you try these suggestions and feel free to ask me any questions.   ACTIVITY/FITNESS Mental, social, emotional and physical stimulation are very important for brain and body health. Try learning a new activity (arts, music, language, sports, games).  Keep moving your body to the best of your abilities. You can do this at home, inside or outside, the park, community center, gym or anywhere you like. Consider a physical therapist or personal trainer to get started. Consider the app Sworkit. Fitness trackers such as smart-watches, smart-phones or Fitbits can help as well.   NUTRITION Eat more plants: colorful vegetables, nuts, seeds and berries.  Eat less sugar, salt, preservatives and processed foods.  Avoid toxins such as cigarettes and alcohol.  Drink water when you are thirsty. Warm water with a slice of lemon is an excellent morning drink to start the day.  Consider these websites for more information The Nutrition Source (hhttps://www.henry-hernandez.biz/ Precision Nutrition (wWindowBlog.ch   RELAXATION Consider practicing mindfulness meditation or other relaxation techniques such as deep breathing, prayer, yoga, tai chi, massage. See website mindful.org or the apps Headspace or Calm to help get  started.   SLEEP Try to get at least 7-8+ hours sleep per day. Regular exercise and reduced caffeine will help you sleep better. Practice good sleep hygeine techniques. See website sleep.org for more information.   PLANNING Prepare estate planning, living will, healthcare POA documents. Sometimes this is best planned with the help of an attorney. Theconversationproject.org and agingwithdignity.org are excellent resources.

## 2015-06-16 NOTE — Progress Notes (Signed)
[facility]  PATIENT: Genene Churn DOB: 1975/11/28  REFERRING CLINICIAN:  HISTORY FROM: patient  REASON FOR VISIT: follow up    HISTORICAL  CHIEF COMPLAINT:  Chief Complaint  Patient presents with  . Pseudotumor cerebri    rm 7  . Follow-up    3 month    HISTORY OF PRESENT ILLNESS:   UPDATE 06/16/15: Since last visit, now on TPX  BID. HA improved since last visit.   UPDATE 03/15/15: Since last visit, HA have increased in last 1 month. More vision changes, sounds in head, HA pain. Has not seen Dr. Dione Booze in a while.   UPDATE 09/15/14: Since last visit, was in hosp for sepsis following buttock abscess aspiration. HA continue. Also with new low back pain radiating to left leg x 1 month.   UPDATE 06/15/14: Since last visit, still has not seen Dr. Dione Booze or PCP. Had LP (opening pressure 35, closing pressure 10).   UPDATE 04/15/14: Since last visit, lost to follow up due to family, financial issues. HA are worsening. Had transient visual obscuration a few weeks ago, lasting 1-2 hours.  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 Will proceed with IIH (pseudotumor cerebri) evaluation. HA still bad. Arm pain still bad. EMG and labs normal. Insurance denied her MRI studies that I ordered last visit.  PRIOR HPI (05/27/12): 39 year old right-handed female history of hypertension, asthma, depression, anxiety, here for evaluation of headache, neck pain, numbness and tingling since November 2013. Patient describes sudden onset pain in her neck, shoulders and head. She had some photophobia and throbbing headache pain. No nausea vomiting. She also felt numbness and tingling going into her arms and fingers. Symptoms most severe in her left hand, digits 4 and 5. Patient went to the emergency room by ambulance, had MRI of the neck which was unremarkable. Since that time symptoms have subsided a little bit. She has had intermittent weakness in her left  hand. Also patient notes insidious onset of decreased vision in her left eye.   REVIEW OF SYSTEMS: Full 14 system review of systems performed and notable only for fatigue light sens eye itching incr thirst diarrhea insomnia snoring back pain depression anxiety numbness headaches dizziness passing out anemia depression anxiety.   ALLERGIES: Allergies  Allergen Reactions  . Aspirin Anaphylaxis  . Shellfish Allergy Anaphylaxis  . Adhesive [Tape] Rash and Other (See Comments)    bandaids tear skin off  . Iodine Swelling and Rash  . Kiwi Extract Rash    Reaction: rash around mouth, with tongue burning  . Latex Hives    HOME MEDICATIONS: Outpatient Prescriptions Prior to Visit  Medication Sig Dispense Refill  . acetaminophen (TYLENOL) 325 MG tablet Take 2 tablets (650 mg total) by mouth every 6 (six) hours as needed for mild pain (or Fever >/= 101).    Marland Kitchen albuterol (PROVENTIL HFA;VENTOLIN HFA) 108 (90 BASE) MCG/ACT inhaler Inhale 2 puffs into the lungs every 6 (six) hours as needed for wheezing or shortness of breath.    . topiramate (TOPAMAX) 100 MG tablet Take 1 tablet (100 mg total) by mouth 3 (three) times daily. (Patient taking differently: Take 100 mg by mouth 2 (two) times daily. ) 90 tablet 12  . latanoprost (XALATAN) 0.005 % ophthalmic solution Place 1 drop into both eyes at bedtime.     No facility-administered medications prior to visit.    PAST MEDICAL HISTORY: Past Medical History  Diagnosis Date  . Asthma   .  Pseudotumor cerebri   . Tobacco abuse   . Abscess     PAST SURGICAL HISTORY: Past Surgical History  Procedure Laterality Date  . Cesarean section      x2    FAMILY HISTORY: Family History  Problem Relation Age of Onset  . Pulmonary embolism Mother   . Pseudotumor cerebri Mother   . Heart attack Father   . Blindness Other     SOCIAL HISTORY:  Social History   Social History  . Marital Status: Single    Spouse Name: N/A  . Number of Children: 2    . Years of Education: HS   Occupational History  . Disabled. Other    n/a   Social History Main Topics  . Smoking status: Current Every Day Smoker -- 0.75 packs/day for 20 years    Types: Cigarettes  . Smokeless tobacco: Never Used  . Alcohol Use: Yes     Comment: socially  . Drug Use: Yes    Special: Marijuana     Comment: occasionally  . Sexual Activity: Not on file   Other Topics Concern  . Not on file   Social History Narrative   Single.  Lives with her 2 children, aged 23 and 72.  Independent of ADLs.  Disabled from anxiety.     PHYSICAL EXAM  Filed Vitals:   06/16/15 1247 06/16/15 1256  BP: 144/94 135/91  Pulse: 99 90  Height:  (1.575 m)   Weight: 260 lb (117.935 kg)     Not recorded     Wt Readings from Last 3 Encounters:  06/16/15 260 lb (117.935 kg)  03/15/15 260 lb 3.2 oz (118.026 kg)  09/15/14 264 lb 3.2 oz (119.84 kg)   Body mass index is 47.54 kg/(m^2).  GENERAL EXAM: Patient is in no distress; well developed, nourished and groomed; neck is supple  CARDIOVASCULAR: Regular rate and rhythm, no murmurs, no carotid bruits  NEUROLOGIC: MENTAL STATUS: awake, alert, language fluent, comprehension intact, naming intact, fund of knowledge appropriate CRANIAL NERVE: SLIGHTLY BLURRED DISC MARGINS, pupils equal and reactive to light, visual fields full to confrontation, extraocular muscles intact, no nystagmus, facial sensation and strength symmetric, hearing intact, palate elevates symmetrically, uvula midline, shoulder shrug symmetric, tongue midline. MOTOR: normal bulk and tone, full strength in the BUE, BLE SENSORY: normal and symmetric to light touch, temperature, vibration COORDINATION: finger-nose-finger, fine finger movements normal REFLEXES: deep tendon reflexes present and symmetric GAIT/STATION: narrow based gait; romberg is negative    DIAGNOSTIC DATA (LABS, IMAGING, TESTING) - I reviewed patient records, labs, notes, testing and  imaging myself where available.  Lab Results  Component Value Date   WBC 7.7 03/15/2015   HGB 10.3* 07/02/2014   HCT 33.2* 03/15/2015   MCV 76.7* 07/02/2014   PLT 295 07/02/2014      Component Value Date/Time   NA 141 03/15/2015 1458   NA 141 07/02/2014 1435   K 4.8 03/15/2015 1458   CL 102 03/15/2015 1458   CO2 24 03/15/2015 1458   GLUCOSE 98 03/15/2015 1458   GLUCOSE 117* 07/02/2014 1435   BUN 11 03/15/2015 1458   BUN 7 07/02/2014 1435   CREATININE 0.72 03/15/2015 1458   CALCIUM 9.2 03/15/2015 1458   PROT 6.6 03/15/2015 1458   ALBUMIN 4.1 03/15/2015 1458   AST 15 03/15/2015 1458   ALT 13 03/15/2015 1458   ALKPHOS 95 03/15/2015 1458   BILITOT <0.2 03/15/2015 1458   GFRNONAA 107 03/15/2015 1458   GFRAA 124 03/15/2015  1458   No results found for: CHOL Lab Results  Component Value Date   HGBA1C 6.0* 03/15/2015   No results found for: [id number] Lab Results  Component Value Date   TSH 0.150* 03/15/2015     09/04/12 MRI BRAIN  1. Partially empty sella and slightly enlarged optic nerve sheaths. Non-specific finding, but can be seen in association with idiopathic intracranial hypertension (IIH or pseudotumor cerebri). 2. Remainder of brain parenchyma unremarkable.  10/04/12 MRV head - Normal MRV head (without contrast). Left side dominant and right side hypoplastic venous drainage pattern may be a normal variant.  04/29/14 LP 1. Technically successful lumbar puncture under fluoroscopy. 2. Elevated opening pressure (35cm H2O), normalized after large volume tap (down to 10cm H2O).    ASSESSMENT AND PLAN  39 y.o. year old female here with pseudotumor cerebri (HA, papilledema, obesity), slightly better in last few months. Many barriers still exist (family, personal, psychosocial) to her improvement in care.   Dx:  No diagnosis found.   PLAN: - continue topiramate 100mg  BID; may increase to TID as tolerated (or 100mg  in AM and 200mg  in PM) - follow up eye exam with  Dr. Dione BoozeGroat; if worse papilledema, then will consider LP - weight loss advice reviewed; she declines bariatric surgery evaluation - consider sleep study (obesity, snoring, daytime fatigue)  Meds ordered this encounter  Medications  . topiramate (TOPAMAX) 100 MG tablet    Sig: Take 1 tablet (100 mg total) by mouth 2 (two) times daily.    Dispense:  180 tablet    Refill:  4   Return in about 6 months (around 12/14/2015).    Suanne MarkerVIKRAM R. Haddy Mullinax, MD 06/16/2015, 1:01 PM Certified in Neurology, Neurophysiology and Neuroimaging  [facility] [address], [address] [place], [place] [address] [phone]

## 2015-12-14 ENCOUNTER — Encounter: Payer: Self-pay | Admitting: Diagnostic Neuroimaging

## 2015-12-14 ENCOUNTER — Ambulatory Visit (INDEPENDENT_AMBULATORY_CARE_PROVIDER_SITE_OTHER): Payer: Medicaid Other | Admitting: Diagnostic Neuroimaging

## 2015-12-14 VITALS — BP 116/74 | HR 88 | Ht 62.0 in | Wt 260.8 lb

## 2015-12-14 DIAGNOSIS — M79602 Pain in left arm: Secondary | ICD-10-CM

## 2015-12-14 DIAGNOSIS — R200 Anesthesia of skin: Secondary | ICD-10-CM

## 2015-12-14 DIAGNOSIS — R208 Other disturbances of skin sensation: Secondary | ICD-10-CM | POA: Diagnosis not present

## 2015-12-14 DIAGNOSIS — G43009 Migraine without aura, not intractable, without status migrainosus: Secondary | ICD-10-CM

## 2015-12-14 DIAGNOSIS — G932 Benign intracranial hypertension: Secondary | ICD-10-CM | POA: Diagnosis not present

## 2015-12-14 MED ORDER — CYCLOBENZAPRINE HCL 5 MG PO TABS: 5.0000 mg | ORAL_TABLET | Freq: Three times a day (TID) | ORAL | Status: AC | PRN

## 2015-12-14 MED ORDER — TOPIRAMATE 100 MG PO TABS: 100.0000 mg | ORAL_TABLET | Freq: Three times a day (TID) | ORAL | Status: DC

## 2015-12-14 NOTE — Patient Instructions (Signed)
-   continue topiramate 100mg  three times per day - follow up eye exam with Dr. Dione BoozeGroat; if worse papilledema, then will consider lumbar puncture - consider sleep study - try cyclobenzaprine 5mg  three times a day as needed for left arm pain/spasm; if no improvement, then will try occupational therapy evaluation; in future may consider MRI cervical spine and electrical nerve testing (EMG/NCS)

## 2015-12-14 NOTE — Progress Notes (Signed)
[facility]  PATIENT: Jasmin Soto DOB: [birth year]  REFERRING CLINICIAN:  HISTORY FROM: patient  REASON FOR VISIT: follow up    HISTORICAL  CHIEF COMPLAINT:  Chief Complaint  Patient presents with  . Pseudotumor cerebri    rm 7, "no vision changes; pain in L shoulder head, neck, arm, fingers numb; Tylenol not helping at all, Topamax and drinking water helps w/HA"  . Follow-up    6 month    HISTORY OF PRESENT ILLNESS:   UPDATE 12/14/15: Since last visit, now on TPX  TID. HA are slightly better. Now with new problem of neck pain, left arm pain, with numbness x 2-3 weeks.   UPDATE 06/16/15: Since last visit, now on TPX  BID. HA improved since last visit.   UPDATE 03/15/15: Since last visit, HA have increased in last 1 month. More vision changes, sounds in head, HA pain. Has not seen Dr. Dione Booze in a while.   UPDATE 09/15/14: Since last visit, was in hosp for sepsis following buttock abscess aspiration. HA continue. Also with new low back pain radiating to left leg x 1 month.   UPDATE 06/15/14: Since last visit, still has not seen Dr. Dione Booze or PCP. Had LP (opening pressure 35, closing pressure 10).   UPDATE 04/15/14: Since last visit, lost to follow up due to family, financial issues. HA are worsening. Had transient visual obscuration a few weeks ago, lasting 1-2 hours.  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 Will proceed with IIH (pseudotumor cerebri) evaluation. HA still bad. Arm pain still bad. EMG and labs normal. Insurance denied her MRI studies that I ordered last visit.  PRIOR HPI (05/27/12): 40 year old right-handed female history of hypertension, asthma, depression, anxiety, here for evaluation of headache, neck pain, numbness and tingling since November 2013. Patient describes sudden onset pain in her neck, shoulders and head. She had some photophobia and throbbing headache pain. No nausea vomiting. She also felt  numbness and tingling going into her arms and fingers. Symptoms most severe in her left hand, digits 4 and 5. Patient went to the emergency room by ambulance, had MRI of the neck which was unremarkable. Since that time symptoms have subsided a little bit. She has had intermittent weakness in her left hand. Also patient notes insidious onset of decreased vision in her left eye.   REVIEW OF SYSTEMS: Full 14 system review of systems performed and negative except for: fatigue light sens eye itching incr thirst diarrhea insomnia snoring back pain depression anxiety numbness headaches dizziness passing out anemia depression anxiety.   ALLERGIES: Allergies  Allergen Reactions  . Aspirin Anaphylaxis  . Shellfish Allergy Anaphylaxis  . Adhesive [Tape] Rash and Other (See Comments)    bandaids tear skin off  . Iodine Swelling and Rash  . Kiwi Extract Rash    Reaction: rash around mouth, with tongue burning  . Latex Hives    HOME MEDICATIONS: Outpatient Prescriptions Prior to Visit  Medication Sig Dispense Refill  . acetaminophen (TYLENOL) 325 MG tablet Take 2 tablets (650 mg total) by mouth every 6 (six) hours as needed for mild pain (or Fever >/= 101).    Marland Kitchen albuterol (PROVENTIL HFA;VENTOLIN HFA) 108 (90 BASE) MCG/ACT inhaler Inhale 2 puffs into the lungs every 6 (six) hours as needed for wheezing or shortness of breath.    . topiramate (TOPAMAX) 100 MG tablet Take 1 tablet (100 mg total) by mouth 2 (two) times daily. 180 tablet 4  . latanoprost (  XALATAN) 0.005 % ophthalmic solution Place 1 drop into both eyes at bedtime. Reported on 12/14/2015     No facility-administered medications prior to visit.    PAST MEDICAL HISTORY: Past Medical History  Diagnosis Date  . Asthma   . Pseudotumor cerebri   . Tobacco abuse   . Abscess     PAST SURGICAL HISTORY: Past Surgical History  Procedure Laterality Date  . Cesarean section      x2    FAMILY HISTORY: Family History  Problem Relation  Age of Onset  . Pulmonary embolism Mother   . Pseudotumor cerebri Mother   . Heart attack Father   . Blindness Other     SOCIAL HISTORY:  Social History   Social History  . Marital Status: Single    Spouse Name: N/A  . Number of Children: 2  . Years of Education: HS   Occupational History  . Disabled. Other    n/a   Social History Main Topics  . Smoking status: Current Every Day Smoker -- 0.75 packs/day for 20 years    Types: Cigarettes  . Smokeless tobacco: Never Used  . Alcohol Use: Yes     Comment: socially  . Drug Use: Yes    Special: Marijuana     Comment: occasionally  . Sexual Activity: Not on file   Other Topics Concern  . Not on file   Social History Narrative   Single.  Lives with her 2 children, aged 38 and 1912.  Independent of ADLs.  Disabled from anxiety.     PHYSICAL EXAM  Filed Vitals:   12/14/15 1255  BP: 116/74  Pulse: 88  Height: 5\' 2"  (1.575 m)  Weight: 260 lb 12.8 oz (118.298 kg)    Not recorded     Wt Readings from Last 3 Encounters:  12/14/15 260 lb 12.8 oz (118.298 kg)  06/16/15 260 lb (117.935 kg)  03/15/15 260 lb 3.2 oz (118.026 kg)   Body mass index is 47.69 kg/(m^2).  GENERAL EXAM: Patient is in no distress; well developed, nourished and groomed; neck is supple  CARDIOVASCULAR: Regular rate and rhythm, no murmurs, no carotid bruits  NEUROLOGIC: MENTAL STATUS: awake, alert, language fluent, comprehension intact, naming intact, fund of knowledge appropriate CRANIAL NERVE: SLIGHTLY BLURRED DISC MARGINS, pupils equal and reactive to light, visual fields full to confrontation, extraocular muscles intact, no nystagmus, facial sensation and strength symmetric, hearing intact, palate elevates symmetrically, uvula midline, shoulder shrug symmetric, tongue midline. MOTOR: normal bulk and tone, full strength in the BUE, BLE SENSORY: normal and symmetric to light touch, temperature, vibration; DECR PP IN LEFT HAND DIGIT  5 COORDINATION: finger-nose-finger, fine finger movements normal REFLEXES: deep tendon reflexes present and symmetric GAIT/STATION: narrow based gait; romberg is negative    DIAGNOSTIC DATA (LABS, IMAGING, TESTING) - I reviewed patient records, labs, notes, testing and imaging myself where available.  Lab Results  Component Value Date   WBC 7.7 03/15/2015   HGB 10.3* 07/02/2014   HCT 33.2* 03/15/2015   MCV 74* 03/15/2015   PLT 331 03/15/2015      Component Value Date/Time   NA 141 03/15/2015 1458   NA 141 07/02/2014 1435   K 4.8 03/15/2015 1458   CL 102 03/15/2015 1458   CO2 24 03/15/2015 1458   GLUCOSE 98 03/15/2015 1458   GLUCOSE 117* 07/02/2014 1435   BUN 11 03/15/2015 1458   BUN 7 07/02/2014 1435   CREATININE 0.72 03/15/2015 1458   CALCIUM 9.2 03/15/2015 1458  PROT 6.6 03/15/2015 1458   ALBUMIN 4.1 03/15/2015 1458   AST 15 03/15/2015 1458   ALT 13 03/15/2015 1458   ALKPHOS 95 03/15/2015 1458   BILITOT <0.2 03/15/2015 1458   GFRNONAA 107 03/15/2015 1458   GFRAA 124 03/15/2015 1458   No results found for: CHOL Lab Results  Component Value Date   HGBA1C 6.0* 03/15/2015   No results found for: [id number] Lab Results  Component Value Date   TSH 0.150* 03/15/2015    09/04/12 MRI BRAIN  1. Partially empty sella and slightly enlarged optic nerve sheaths. Non-specific finding, but can be seen in association with idiopathic intracranial hypertension (IIH or pseudotumor cerebri). 2. Remainder of brain parenchyma unremarkable.  10/04/12 MRV head - Normal MRV head (without contrast). Left side dominant and right side hypoplastic venous drainage pattern may be a normal variant.  04/29/14 LP 1. Technically successful lumbar puncture under fluoroscopy. 2. Elevated opening pressure (35cm H2O), normalized after large volume tap (down to 10cm H2O).    ASSESSMENT AND PLAN  40 y.o. year old female here with pseudotumor cerebri (HA, papilledema, obesity), slightly better in  last few months. Many barriers still exist (family, personal, psychosocial) to her improvement in care.    Dx:  Pseudotumor cerebri  Migraine without aura and without status migrainosus, not intractable  Morbid obesity, unspecified obesity type (HCC)    PLAN: - continue topiramate 100mg  three times per day - follow up eye exam with Dr. Dione Booze; if worse papilledema, then will consider lumbar puncture - weight loss advice reviewed (declines bariatric surgery evaluation at this time) - consider sleep study (declines at this time) - try cyclobenzaprine 5mg  three times a day as needed for left arm pain/spasm; if no improvement, then will try occupational therapy evaluation; in future may consider MRI cervical spine and electrical nerve testing (EMG/NCS)  Meds ordered this encounter  Medications  . topiramate (TOPAMAX) 100 MG tablet    Sig: Take 1 tablet (100 mg total) by mouth 3 (three) times daily.    Dispense:  270 tablet    Refill:  4  . cyclobenzaprine (FLEXERIL) 5 MG tablet    Sig: Take 1 tablet (5 mg total) by mouth 3 (three) times daily as needed for muscle spasms.    Dispense:  30 tablet    Refill:  0   Return in about 6 months (around 06/15/2016).    Suanne Marker, MD 12/14/2015, 1:15 PM Certified in Neurology, Neurophysiology and Neuroimaging  [facility] [address] [place] [address], [address] [place], [place] [address] [phone]

## 2016-06-19 ENCOUNTER — Ambulatory Visit: Payer: Medicaid Other | Admitting: Diagnostic Neuroimaging

## 2016-07-03 ENCOUNTER — Ambulatory Visit: Payer: Medicaid Other | Admitting: Diagnostic Neuroimaging

## 2016-07-04 ENCOUNTER — Telehealth: Payer: Self-pay | Admitting: *Deleted

## 2016-07-04 NOTE — Telephone Encounter (Signed)
Spoke with patient and advised her that her FU needs to be rescheduled due to provider out of office. Follow up rescheduled to 08/03/16, requested she arrive 15 min early. Patient verbalized understanding.

## 2016-08-03 ENCOUNTER — Ambulatory Visit (INDEPENDENT_AMBULATORY_CARE_PROVIDER_SITE_OTHER): Payer: Medicaid Other | Admitting: Diagnostic Neuroimaging

## 2016-08-03 ENCOUNTER — Encounter: Payer: Self-pay | Admitting: Diagnostic Neuroimaging

## 2016-08-03 VITALS — BP 115/76 | HR 71 | Wt 255.8 lb

## 2016-08-03 DIAGNOSIS — G932 Benign intracranial hypertension: Secondary | ICD-10-CM

## 2016-08-03 DIAGNOSIS — G43009 Migraine without aura, not intractable, without status migrainosus: Secondary | ICD-10-CM | POA: Diagnosis not present

## 2016-08-03 MED ORDER — TOPIRAMATE 100 MG PO TABS: 100.0000 mg | ORAL_TABLET | Freq: Three times a day (TID) | ORAL | 4 refills | Status: AC

## 2016-08-03 MED ORDER — ACETAZOLAMIDE 250 MG PO TABS: 250.0000 mg | ORAL_TABLET | Freq: Two times a day (BID) | ORAL | 12 refills | Status: AC

## 2016-08-03 NOTE — Progress Notes (Signed)
[facility]  PATIENT: Jasmin Soto DOB: 06/15/1976  REFERRING CLINICIAN:  HISTORY FROM: patient  REASON FOR VISIT: follow up    HISTORICAL  CHIEF COMPLAINT:  Chief Complaint  Patient presents with  . Pseudotumor cerebri    rm 7, "pain in neck radiating down shoulders; pressure behind my sinuses"  . Follow-up    6 month    HISTORY OF PRESENT ILLNESS:   UPDATE 08/03/16: Since last visit, now patient living in with brother in [place] (due to being kicked out of her apt by inspectors). More headaches, facial swelling, neck pain shoulder pain. Has had transient visual obscuration x 10 min in Nov 2017.   UPDATE 12/14/15: Since last visit, now on TPX 100mg  TID. HA are slightly better. Now with new problem of neck pain, left arm pain, with numbness x 2-3 weeks.   UPDATE 06/16/15: Since last visit, now on TPX 100mg  BID. HA improved since last visit.   UPDATE 03/15/15: Since last visit, HA have increased in last 1 month. More vision changes, sounds in head, HA pain. Has not seen Dr. Dione BoozeGroat in a while.   UPDATE 09/15/14: Since last visit, was in hosp for sepsis following buttock abscess aspiration. HA continue. Also with new low back pain radiating to left leg x 1 month.   UPDATE 06/15/14: Since last visit, still has not seen Dr. Dione BoozeGroat or PCP. Had LP (opening pressure 35, closing pressure 10).   UPDATE 04/15/14: Since last visit, lost to follow up due to family, financial issues. HA are worsening. Had transient visual obscuration a few weeks ago, lasting 1-2 hours.  UPDATE 08/25/12: Since last visit, Dr. Dione BoozeGroat dx'd chronic papilledema. Will proceed with IIH (pseudotumor cerebri) evaluation. HA still bad. Arm pain still bad. EMG and labs normal. Insurance denied her MRI studies that I ordered last visit.  PRIOR HPI (05/27/12): 41 year old right-handed female history of hypertension, asthma, depression, anxiety, here for evaluation of headache, neck pain, numbness and  tingling since November 2013. Patient describes sudden onset pain in her neck, shoulders and head. She had some photophobia and throbbing headache pain. No nausea vomiting. She also felt numbness and tingling going into her arms and fingers. Symptoms most severe in her left hand, digits 4 and 5. Patient went to the emergency room by ambulance, had MRI of the neck which was unremarkable. Since that time symptoms have subsided a little bit. She has had intermittent weakness in her left hand. Also patient notes insidious onset of decreased vision in her left eye.   REVIEW OF SYSTEMS: Full 14 system review of systems performed and negative except for: fatigue facial swelling runny nose temp intolerance anemia agitation confusion depression anxiety diarrhea rectal bleeding light sens loss of vision blurred vision.    ALLERGIES: Allergies  Allergen Reactions  . Aspirin Anaphylaxis  . Ibuprofen Other (See Comments)    Throat swelling  . Shellfish Allergy Anaphylaxis  . Adhesive [Tape] Rash and Other (See Comments)    bandaids tear skin off  . Iodine Swelling and Rash  . Kiwi Extract Rash    Reaction: rash around mouth, with tongue burning  . Latex Hives    HOME MEDICATIONS: Outpatient Medications Prior to Visit  Medication Sig Dispense Refill  . acetaminophen (TYLENOL) 325 MG tablet Take 2 tablets (650 mg total) by mouth every 6 (six) hours as needed for mild pain (or Fever >/= 101).    Marland Kitchen. albuterol (PROVENTIL HFA;VENTOLIN HFA) 108 (90 BASE) MCG/ACT inhaler Inhale 2  puffs into the lungs every 6 (six) hours as needed for wheezing or shortness of breath.    . cyclobenzaprine (FLEXERIL) 5 MG tablet Take 1 tablet (5 mg total) by mouth 3 (three) times daily as needed for muscle spasms. 30 tablet 0  . topiramate (TOPAMAX) 100 MG tablet Take 1 tablet (100 mg total) by mouth 3 (three) times daily. 270 tablet 4  . latanoprost (XALATAN) 0.005 % ophthalmic solution Place 1 drop into both eyes at bedtime.  Reported on 12/14/2015     No facility-administered medications prior to visit.     PAST MEDICAL HISTORY: Past Medical History:  Diagnosis Date  . Abscess   . Asthma   . Pseudotumor cerebri   . Tobacco abuse     PAST SURGICAL HISTORY: Past Surgical History:  Procedure Laterality Date  . CESAREAN SECTION     x2    FAMILY HISTORY: Family History  Problem Relation Age of Onset  . Pulmonary embolism Mother   . Pseudotumor cerebri Mother   . Heart attack Father   . Blindness Other     SOCIAL HISTORY:  Social History   Social History  . Marital status: Single    Spouse name: N/A  . Number of children: 2  . Years of education: HS   Occupational History  . Disabled. Other    n/a   Social History Main Topics  . Smoking status: Current Every Day Smoker    Packs/day: 0.75    Years: 20.00    Types: Cigarettes  . Smokeless tobacco: Never Used     Comment: 08/03/16 down to 5-10 cigs daily  . Alcohol use Yes     Comment: socially  . Drug use:     Types: Marijuana     Comment: occasionally  . Sexual activity: Not on file   Other Topics Concern  . Not on file   Social History Narrative   Single.  Lives with her 2 children, aged 56 and 38.  Independent of ADLs.  Disabled from anxiety.     PHYSICAL EXAM  Vitals:   08/03/16 0950  BP: 115/76  Pulse: 71  Weight: 255 lb 12.8 oz (116 kg)    Not recorded     Wt Readings from Last 3 Encounters:  08/03/16 255 lb 12.8 oz (116 kg)  12/14/15 260 lb 12.8 oz (118.3 kg)  06/16/15 260 lb (117.9 kg)   Body mass index is 46.79 kg/m.  GENERAL EXAM: Patient is in no distress; well developed, nourished and groomed; neck is supple  CARDIOVASCULAR: Regular rate and rhythm, no murmurs, no carotid bruits  NEUROLOGIC: MENTAL STATUS: awake, alert, language fluent, comprehension intact, naming intact, fund of knowledge appropriate CRANIAL NERVE: SLIGHTLY BLURRED DISC MARGINS, pupils equal and reactive to light, visual  fields full to confrontation, extraocular muscles intact, no nystagmus, facial sensation and strength symmetric, hearing intact, palate elevates symmetrically, uvula midline, shoulder shrug symmetric, tongue midline. MOTOR: normal bulk and tone, full strength in the BUE, BLE SENSORY: normal and symmetric to light touch, temperature, vibration COORDINATION: finger-nose-finger, fine finger movements normal REFLEXES: deep tendon reflexes present and symmetric GAIT/STATION: narrow based gait; romberg is negative    DIAGNOSTIC DATA (LABS, IMAGING, TESTING) - I reviewed patient records, labs, notes, testing and imaging myself where available.  Lab Results  Component Value Date   WBC 7.7 03/15/2015   HGB 10.3 (L) 07/02/2014   HCT 33.2 (L) 03/15/2015   MCV 74 (L) 03/15/2015   PLT 331 03/15/2015  Component Value Date/Time   NA 141 03/15/2015 1458   K 4.8 03/15/2015 1458   CL 102 03/15/2015 1458   CO2 24 03/15/2015 1458   GLUCOSE 98 03/15/2015 1458   GLUCOSE 117 (H) 07/02/2014 1435   BUN 11 03/15/2015 1458   CREATININE 0.72 03/15/2015 1458   CALCIUM 9.2 03/15/2015 1458   PROT 6.6 03/15/2015 1458   ALBUMIN 4.1 03/15/2015 1458   AST 15 03/15/2015 1458   ALT 13 03/15/2015 1458   ALKPHOS 95 03/15/2015 1458   BILITOT <0.2 03/15/2015 1458   GFRNONAA 107 03/15/2015 1458   GFRAA 124 03/15/2015 1458   No results found for: CHOL Lab Results  Component Value Date   HGBA1C 6.0 (H) 03/15/2015   No results found for: [id number] Lab Results  Component Value Date   TSH 0.150 (L) 03/15/2015    09/04/12 MRI BRAIN  1. Partially empty sella and slightly enlarged optic nerve sheaths. Non-specific finding, but can be seen in association with idiopathic intracranial hypertension (IIH or pseudotumor cerebri). 2. Remainder of brain parenchyma unremarkable.  10/04/12 MRV head - Normal MRV head (without contrast). Left side dominant and right side hypoplastic venous drainage pattern may be a normal  variant.  04/29/14 LP 1. Technically successful lumbar puncture under fluoroscopy. 2. Elevated opening pressure (35cm H2O), normalized after large volume tap (down to 10cm H2O).    ASSESSMENT AND PLAN  41 y.o. year old female here with pseudotumor cerebri (HA, papilledema, obesity), slightly better in last few months. Many barriers still exist (family, personal, psychosocial) to her improvement in care. Has done fair on TPX, which we tried due to combination of IIH and migraines.   Dx:  Pseudotumor cerebri - Plan: DG FLUORO GUIDED LOC OF NEEDLE/CATH TIP FOR SPINAL INJECT LT, Ambulatory referral to Neurology  Migraine without aura and without status migrainosus, not intractable  Morbid obesity, unspecified obesity type (HCC)  Severe obesity (BMI >= 40) (HCC) - Plan: Ambulatory referral to Neurology    PLAN: - continue topiramate 100mg  three times per day - start acetazolamide 250mg  twice a day - encouraged patient to establish with local PCP, neurology and ophthalmology in [place]  - will setup fluoro-guided LP near [place] - weight loss advice reviewed - consider sleep study  Meds ordered this encounter  Medications  . topiramate (TOPAMAX) 100 MG tablet    Sig: Take 1 tablet (100 mg total) by mouth 3 (three) times daily.    Dispense:  270 tablet    Refill:  4  . acetaZOLAMIDE (DIAMOX) 250 MG tablet    Sig: Take 1 tablet (250 mg total) by mouth 2 (two) times daily.    Dispense:  60 tablet    Refill:  12   Orders Placed This Encounter  Procedures  . DG FLUORO GUIDED LOC OF NEEDLE/CATH TIP FOR SPINAL INJECT LT  . Ambulatory referral to Neurology   Return for will refer to neurology in [place], [place].    Suanne Marker, MD 08/03/2016, 10:21 AM Certified in Neurology, Neurophysiology and Neuroimaging  [facility] [address] [place], [place] [address] [phone]

## 2016-08-03 NOTE — Patient Instructions (Signed)
-   continue topiramate 100mg  three times per day  - start acetazolamide (diamox) 250mg  twice a day  - setup appointments with local primary care physician, neurology and ophthalmology in [place]   - we will setup fluoro-guided LP near [place]

## 2016-08-07 ENCOUNTER — Ambulatory Visit: Payer: Medicaid Other | Admitting: Diagnostic Neuroimaging

## 2016-08-12 ENCOUNTER — Telehealth: Payer: Self-pay | Admitting: Neurology

## 2016-08-12 NOTE — Telephone Encounter (Signed)
   The patient called. She is having bright red blood per rectum. They wanted to know if the Diamox was the cause. I indicated that this was probably not the case, she may have hemorrhoids.

## 2016-10-02 ENCOUNTER — Encounter: Payer: Self-pay | Admitting: Diagnostic Neuroimaging

## 2016-10-02 ENCOUNTER — Telehealth: Payer: Self-pay | Admitting: Diagnostic Neuroimaging

## 2016-10-02 NOTE — Telephone Encounter (Signed)
Tiffany with [facility] is needing clarification on patients lumbar puncture for this morning.  Please call [phone] give clarification to team leader Darl PikesSusan.

## 2016-10-02 NOTE — Telephone Encounter (Signed)
Received call back from [place], IR, [facility] stating she needed verbal read back of all orders and date of orders. This RN gave her all LP order details she required. She verbalized understanding, appreciation.

## 2016-10-02 NOTE — Telephone Encounter (Addendum)
Called Tiffany who stated staff did not see orders for other CSF tests. Requesting clarification. Will call back with Dr Richrd HumblesPenumalli's specific orders.  Orders under imaging tab, however Domingo PulseSusan, [facility] stated she could not see orders. Requested orders be faxed, no signature necessary. Orders faxed.
